# Patient Record
Sex: Male | Born: 1980 | Race: White | Hispanic: No | Marital: Single
Health system: Midwestern US, Community
[De-identification: ages and names within clinical notes are randomized; demographics above are authoritative.]

## PROBLEM LIST (undated history)

## (undated) DIAGNOSIS — K921 Melena: Principal | ICD-10-CM

## (undated) LAB — GXT EXERCISE (STRESS ONLY)
1 Minute Post HR: 173 {beats}/min
1.7 / 10% HR (Additional): 120 {beats}/min
1.7 / 10% HR: 115 {beats}/min
1.7 / 10% Time (Additional): 3
1.7 / 10% Time: 1
2 Minute Post HR: 142 {beats}/min
2.5 / 12% HR (Additional): 131 {beats}/min
2.5 / 12% HR: 130 {beats}/min
2.5 / 12% Time (Additional): 6
2.5 / 12% Time: 4
3 Minute Post HR: 129 {beats}/min
3.4 / 14% HR (Additional): 164 {beats}/min
3.4 / 14% HR: 144 {beats}/min
3.4 / 14% Time (Additional): 9
3.4 / 14% Time: 7
4 Minute Post HR: 123 {beats}/min
4.2 / 16% HR: 181 {beats}/min
4.2 / 16% Time: 10
5 Minute Post HR: 114 {beats}/min
6 Minute Post HR: 108 {beats}/min
7 Minute Post HR: 101 {beats}/min
Angina Score During Excercise: 0
CALC Duke Treadmill Score: 10
Exercise Duration (min): 10 minutes
Exercise Time (Seconds): 14 Seconds
Immediate Stop HR: 184 {beats}/min
Max Grade: 16
Max Speed: 4.2
ST Seg Deviation: 0 mm
Standing HR: 87 {beats}/min
Supine HR: 63
Target Heart Rate: 158

---

## 2006-05-06 IMAGING — CR DG CHEST 2V
2 series · 2 of 2 positions shown · non-contrast
Comparison: none

CLINICAL DATA: Chest pain. 
 CHEST - 2 VIEW - 06/20/05: 
 The heart size and mediastinal contours are within normal limits.  Both lungs are clear.  The visualized skeletal structures are unremarkable.

[w chest pa]
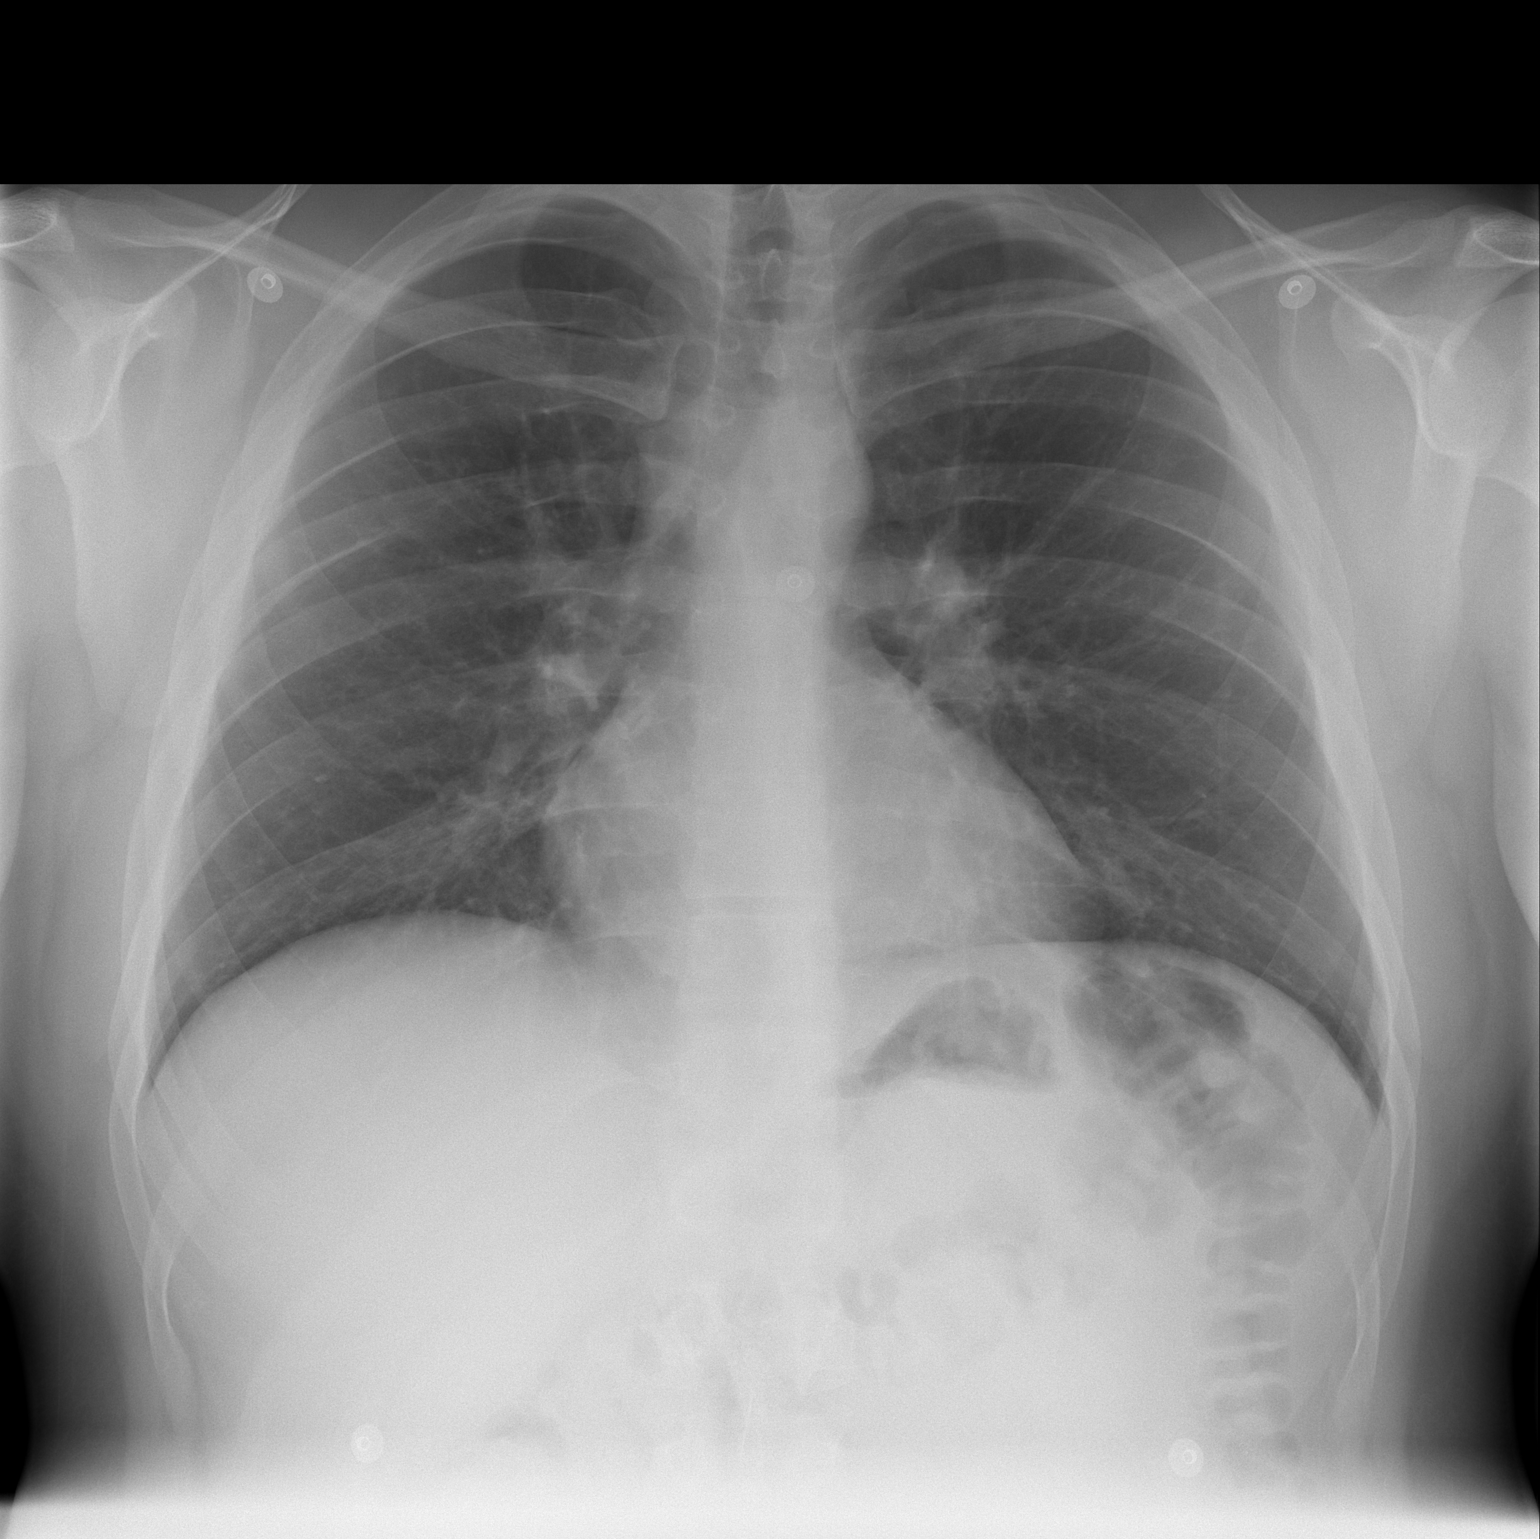

[w chest lat]
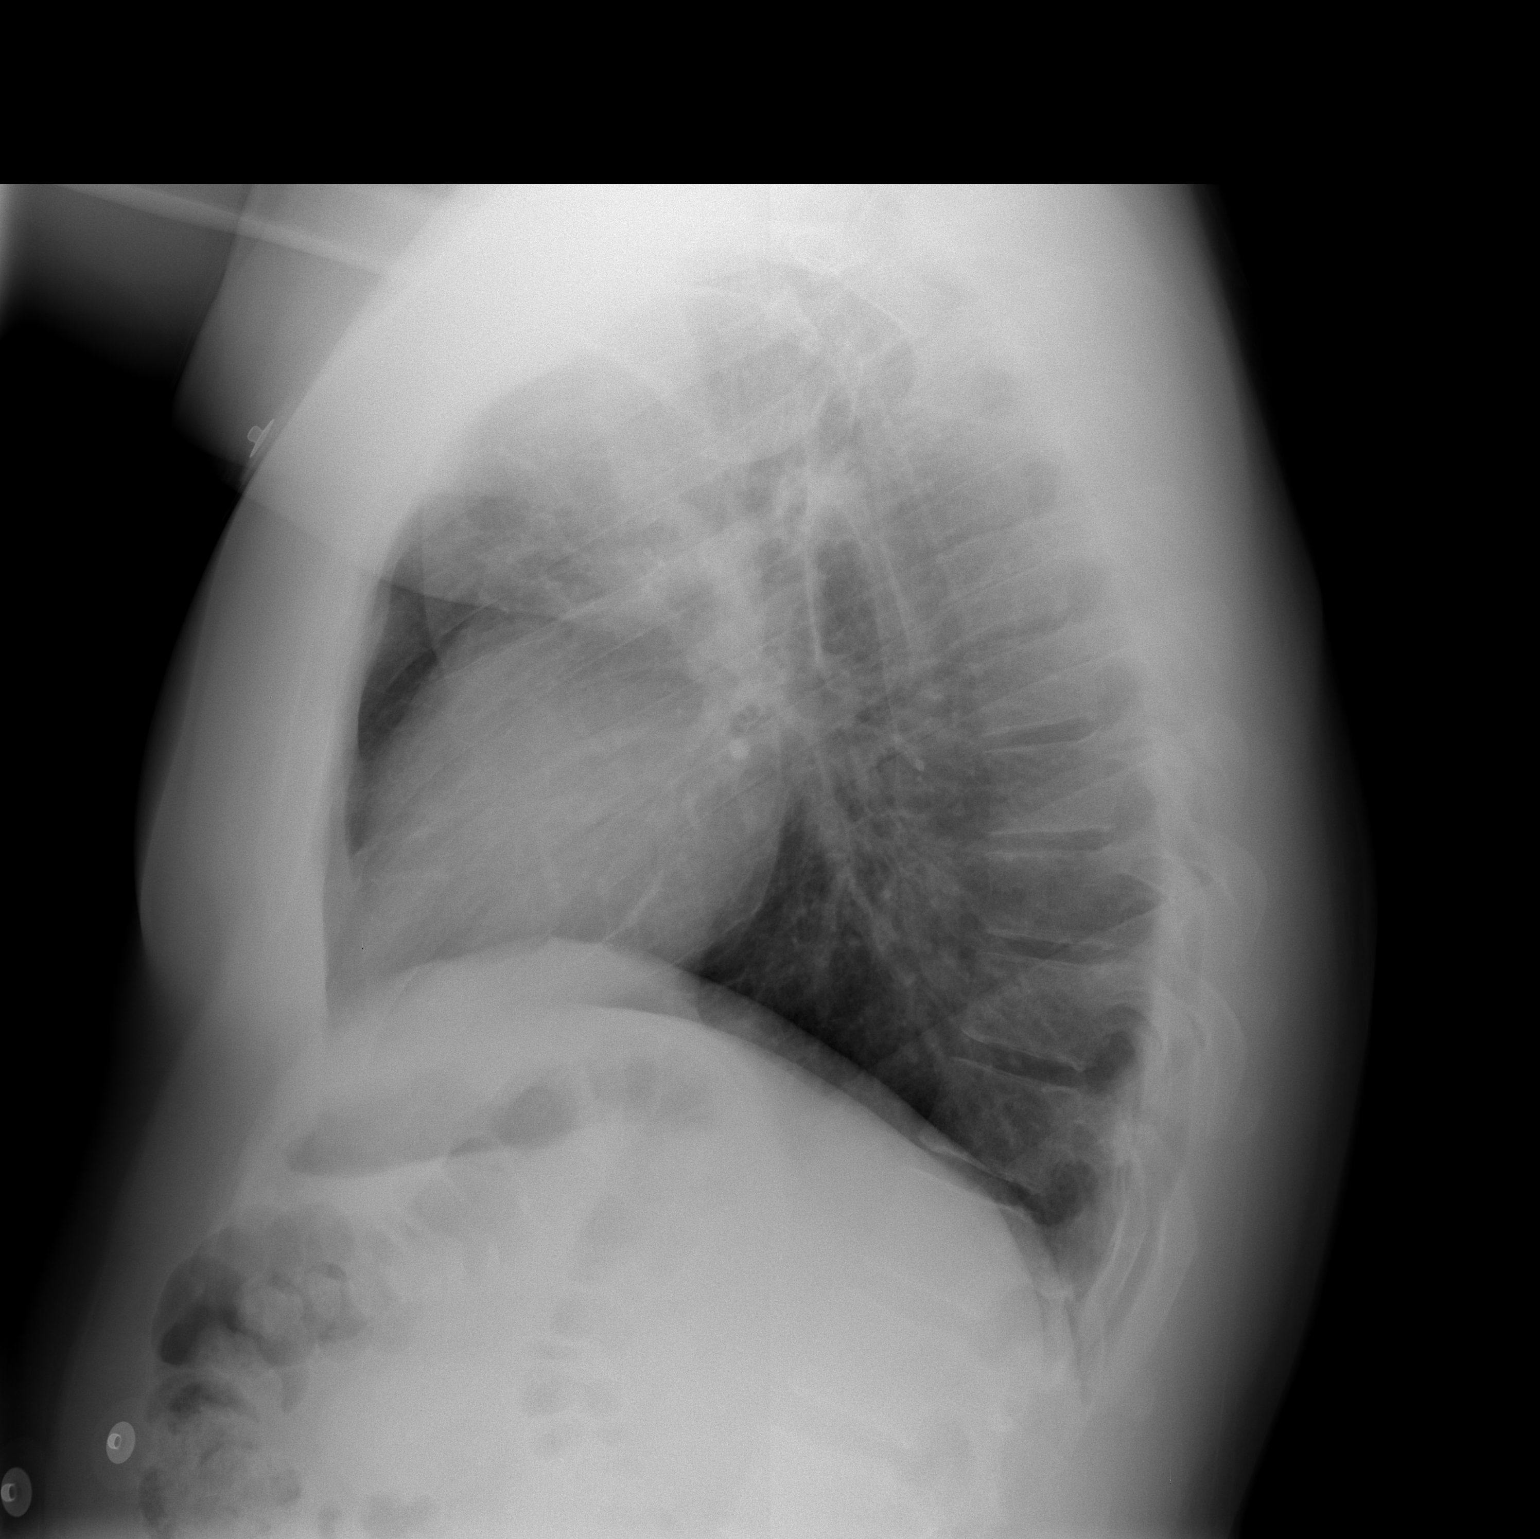

[2 of 2 positions shown; findings below may reference images not displayed]

IMPRESSION: No active cardiopulmonary disease.

## 2016-08-07 ENCOUNTER — Inpatient Hospital Stay: Admit: 2016-08-07 | Primary: Family Medicine

## 2016-08-07 NOTE — Other (Signed)
[facility] ??? Orthopaedics [facility], [place]  [address], [address]    [place], [place] [id number]  Phone: [phone]   Fax:     [phone]    Physical Therapy Daily Treatment Note  Date:  08/07/2016    Patient Name:  Zachary GibbsJoseph Gregory    DOB:  11/04/1981  MRN: [id number]  Restrictions/Precautions:    Medical/Treatment Diagnosis Information:  Diagnosis: S39.012 Lumbar Strain  Treatment Diagnosis: M45.5 Pain in Lumbar Spine  Insurance/Certification information:  [facility]: [facility]  Physician Information:  Referring Practitioner: Alyce Paganimothy J Linker  Plan of care signed (Y/N):     Date of Patient follow up with Physician:     G-Code (if applicable):      Date G-Code Applied:  08/07/16 [place]: 14% deficit  PT G-Codes  Functional Assessment Tool Used: [place]  Score: 14%  Functional Limitation: Changing and maintaining body position  Changing and Maintaining Body Position Current Status (L2440(G8981): At least 1 percent but less than 20 percent impaired, limited or restricted  Changing and Maintaining Body Position Goal Status [phone](G8982): 0 percent impaired, limited or restricted    Progress Note: [x]   Yes  []   No  Next due by: Visit #10  Or 09/06/16    Latex Allergy:  [x] NO      [] YES  Preferred Language for Healthcare:   [x] English       [] other:    Visit # Insurance Allowable   1  9/20 120     Pain level:  0-5/10  9/20     SUBJECTIVE:  See eval   9/20     OBJECTIVE:   ROM  9/20  Comments   Trunk flexion WNL Pain present   Trunk extension WNL    Trunk R sidebend WNL    Trunk L sidebend WNL    Trunk R rotation WNL    Trunk L rotation WNL    HS flexibility                 Strength  9/20 Left Right Comments   Hip flexion(L2) 5 5    Knee extension(L3) 5 5    Knee flexion(S1-2) 5 5 Pain in back with MMT   Ankle dorsiflexion(L4) 5 5    Toe extension(L5) 5 5    Ankle eversion/plantar flexion(S1) 5 5      Special tests 9/20  Comments   SLR negative    Slump test     Pelvic symmetry  R higher  than left    Segmental Spinal mobility     Heel walk negative    Toe walk negative    Tandem walk            DTR???s  9/20 Left Right Comments   Patellar(L3-L4) = =    Achilles(S1-S2) = =            Joint mobility: 9/20   [x] Normal    [] Hypo   [] Hyper    Palpation: Tenderness bilateral PSIS  9/20    Functional Mobility/Transfers: Independent with increased pain   9/20    Posture: Forward head and rounded shoulders; decreased lumbar lordosis in sitting  9/20    Gait: (include devices/WB status) WNL  9/20                         [x]  Patient history, allergies, meds reviewed. Medical chart reviewed. See intake form.  9/20  Review Of Systems (ROS):  [x] Performed Review of systems (Integumentary, CardioPulmonary, Neurological) by intake and observation. Intake form has been scanned into medical record. Patient has been instructed to contact their primary care physician regarding ROS issues if not already being addressed at this time.  9/20    RESTRICTIONS/PRECAUTIONS: None    Exercises/Interventions: BILATERAL  ROM/stretches     SKTC 30 sec x 5 Start 9/20   DKTC     Prayer stretch     Supine HS 30 sec x 5 Start 9/20   Pelvic tilt     Hook lying rotation     Cat and camel     Piriformis 30 sec x 5 Start 9/20   Strengthening     SLR     Quadruped alternate UE reaches     Quadruped alternate LE reaches     Quadruped alternate UE/LE reaches     [employer] heel raises     Sit ups      planks     Tband lat pulls     Tband rows         Manual Intervention             Prone PA      GISTM/STM      Lumbar Manip      SI Manip      Hip belt mobs      Hip LA distraction              Therapeutic Exercise and NMR EXR  [x]  (97110) Provided verbal/tactile cueing for activities related to strengthening, flexibility, endurance, ROM  for improvements in proximal hip and core control with self care, mobility, lifting and ambulation.  []  [phone]) Provided verbal/tactile cueing for activities related to improving balance,  coordination, kinesthetic sense, posture, motor skill, proprioception  to assist with core control in self care, mobility, lifting, and ambulation.     Therapeutic Activities:    []  [phone] or 95621) Provided verbal/tactile cueing for activities related to improving balance, coordination, kinesthetic sense, posture, motor skill, proprioception and motor activation to allow for proper function  with self care and ADLs  []  [phone]) Provided training and instruction to the patient for proper core and proximal hip recruitment and positioning with ambulation re-education     Home Exercise Program:    [x]  (78469) Reviewed/Progressed HEP activities related to strengthening, flexibility, endurance, ROM of core, proximal hip and LE for functional self-care, mobility, lifting and ambulation   []  (62952) Reviewed/Progressed HEP activities related to improving balance, coordination, kinesthetic sense, posture, motor skill, proprioception of core, proximal hip and LE for self care, mobility, lifting, and ambulation      Manual Treatments:  PROM / STM / Oscillations-Mobs:  G-I, II, III, IV (PA's, Inf., Post.)  []  (97140) Provided manual therapy to mobilize proximal hip and LS spine soft tissue/joints for the purpose of modulating pain, promoting relaxation,  increasing ROM, reducing/eliminating soft tissue swelling/inflammation/restriction, improving soft tissue extensibility and allowing for proper ROM for normal function with self care, mobility, lifting and ambulation.     Modalities:       Charges:  Timed Code Treatment Minutes: 30 + EVAL   Total Treatment Minutes: 70       [x]  EVAL (LOW) 97161 (typically 20 minutes face-to-face)  []  EVAL (MOD) 84132 (typically 30 minutes face-to-face)  []  EVAL (HIGH) 97163 (typically 45 minutes face-to-face)  []  RE-EVAL     [x]  GM(01027) x  2   []  IONTO  []  NMR (11914) x      []  VASO  []  Manual (97140) x       []  Other:  []  TA x       []  Mech T[employer] (78295)  []  ES(attended) (62130)      []   ES (un) (86578):     Goals:   Patient stated goal: Reduce pain; improve core strength and posture    Therapist goals for Patient:   Short Term Goals: To be achieved in: 2 weeks  1. Independent in HEP and progression per patient tolerance, in order to prevent re-injury.   2. Patient will have a decrease in pain to facilitate improvement in movement, function, and ADLs as indicated by Functional Deficits.    Long Term Goals: To be achieved in: 3-6 weeks  1. Disability index score of 10% or less for the [place] to assist with reaching prior level of function in 6 weeks.   2. Patient will demonstrate increased AROM to WNL, good LS mobility, good hip ROM to allow for proper joint functioning as indicated by patients Functional Deficits in 4 weeks.   3. Patient will demonstrate an increase in Strength to good proximal hip and core activation to allow for proper functional mobility as indicated by patients Functional Deficits in 6 weeks.   4. Patient will return to independent functional activities without increased symptoms or restriction in 4 weeks.     Progression Towards Functional goals:  []  Patient is progressing as expected towards functional goals listed.    []  Progression is slowed due to complexities listed.  []  Progression has been slowed due to co-morbidities.  [x]  Plan just implemented, too soon to assess goals progression  []  Other:     ASSESSMENT:  See eval    Treatment/Activity Tolerance:  [x]  Patient tolerated treatment well []  Patient limited by fatique  []  Patient limited by pain  []  Patient limited by other medical complications  []  Other:     Patient education:  9/20 HEP provided and reviewed     Prognosis: [x]  Good []  Fair  []  Poor    Patient Requires Follow-up: [x]  Yes  []  No    PLAN: See eval  []  Continue per plan of care []  Alter current plan (see comments)  [x]  Plan of care initiated []  Hold pending MD visit []  Discharge    Electronically signed by: Dillon Bjork PT, DPT

## 2016-08-07 NOTE — Plan of Care (Cosign Needed)
[facility] ??? Orthopaedics and Sports Rehabilitation  [address] [place], [place]  Phone [phone]  Fax [phone]      Physical Therapy Certification    Dear Referring Practitioner: Alyce Pagan,    We had the pleasure of evaluating the following patient for physical therapy services at [facility] and [facility].  A summary of our findings can be found in the initial assessment below.  This includes our plan of care.  If you have any questions or concerns regarding these findings, please do not hesitate to contact me at the office phone number checked above.  Thank you for the referral.       Physician Signature:_______________________________Date:__________________  By signing above (or electronic signature), therapist???s plan is approved by physician      Patient: Zachary Gibbs "Joe"   DOB: [birth year]   MRN: [id number]  Referring Physician: Referring Practitioner: Alyce Pagan      Evaluation Date: 08/07/2016      Medical Diagnosis Information:  Diagnosis: S39.012 Lumbar Strain   Treatment Diagnosis: M45.5 Pain in Lumbar Spine                                         Insurance information: PT Insurance Information: [employer]HC     Precautions/ Contra-indications:   Latex Allergy:  [x] NO      [] YES  Preferred Language for Healthcare:   [x] English       [] other:    SUBJECTIVE: Patient stated complaint: Patient states that the pain in his back started about 4-5 months ago. He states that the pain became worse and has started to travel down the left leg. He denies a particular incident or trauma. He states that his job is pretty physical and he is on his feet quite a bit and the job also entails driving for extended periods of time. He denies radiographs and MRIs being performed. He states he hasn't taken any of the prescribed anti-inflammatories due to the side effects. He states the pain is a dull pain that is constant and when he has to transition from sitting to  standing the pain in the back is sharp.     Relevant Medical History:None  Functional Disability Index/G-Codes:  PT G-Codes  Functional Assessment Tool Used: [place]  Score: 14%  Functional Limitation: Changing and maintaining body position  Changing and Maintaining Body Position Current Status (V7846): At least 1 percent but less than 20 percent impaired, limited or restricted  Changing and Maintaining Body Position Goal Status [phone]): 0 percent impaired, limited or restricted    Pain Scale: 0-5/10  Easing factors: ice  Provocative factors: Sitting for extended period of time; standing for extended period of time; transitioning from sitting to standing    Type: [x] Constant   [] Intermittent  [] Radiating [] Localized [] other:     Numbness/Tingling: Denies    Occupation/School: Geographical information systems officer for [employer]    Living Status/Prior Level of Function: Independent with ADLs and IADLs    OBJECTIVE:   ROM  2/84  Comments   Trunk flexion WNL Pain present   Trunk extension WNL    Trunk R sidebend WNL    Trunk L sidebend WNL    Trunk R rotation WNL    Trunk L rotation WNL    HS flexibility  Strength  9/20 Left Right Comments   Hip flexion(L2) 5 5    Knee extension(L3) 5 5    Knee flexion(S1-2) 5 5 Pain in back with MMT   Ankle dorsiflexion(L4) 5 5    Toe extension(L5) 5 5    Ankle eversion/plantar flexion(S1) 5 5      Special tests 9/20  Comments   SLR negative    Slump test     Pelvic symmetry  R higher than left    Segmental Spinal mobility     Heel walk negative    Toe walk negative    Tandem walk            DTR???s  9/20 Left Right Comments   Patellar(L3-L4) = =    Achilles(S1-S2) = =            Joint mobility: 9/20   [x] Normal    [] Hypo   [] Hyper    Palpation: Tenderness bilateral PSIS  9/20    Functional Mobility/Transfers: Independent with increased pain   9/20    Posture: Forward head and rounded shoulders; decreased lumbar lordosis in sitting  9/20    Gait: (include devices/WB status) WNL  9/20                          [x]  Patient history, allergies, meds reviewed. Medical chart reviewed. See intake form.  9/20    Review Of Systems (ROS):  [x] Performed Review of systems (Integumentary, CardioPulmonary, Neurological) by intake and observation. Intake form has been scanned into medical record. Patient has been instructed to contact their primary care physician regarding ROS issues if not already being addressed at this time.  9/20    Co-morbidities/Complexities (which will affect course of rehabilitation):   [x] None           Arthritic conditions   [] Rheumatoid arthritis (M05.9)  [] Osteoarthritis (M19.91)   Cardiovascular conditions   [] Hypertension (I10)  [] Hyperlipidemia (E78.5)  [] Angina pectoris (I20)  [] Atherosclerosis (I70)   Musculoskeletal conditions   [] Disc pathology   [] Congenital spine pathologies   [] Prior surgical intervention  [] Osteoporosis (M81.8)  [] Osteopenia (M85.8)   Endocrine conditions   [] Hypothyroid (E03.9)  [] Hyperthyroid Gastrointestinal conditions   [] Constipation (K59.00)   Metabolic conditions   [] Morbid obesity (E66.01)  [] Diabetes type 1(E10.65) or 2 (E11.65)   [] Neuropathy (G60.9)     Pulmonary conditions   [] Asthma (J45)  [] Coughing   [] COPD (J44.9)   Psychological Disorders  [] Anxiety (F41.9)  [] Depression (F32.9)   [] Other:   [] Other:           Barriers to/and or personal factors that will affect rehab potential:              [x] Age  [x] Sex              [x] Motivation/Lack of Motivation                        [] Co-Morbidities              [] Cognitive Function, education/learning barriers              [] Environmental, home barriers              [x] profession/work barriers  [] past PT/medical experience  [] other:  Justification: Patient is a motivated 35 year old male who presents to the clinic with reports of low back pain. [facility] as well as improve his  postural awareness and his core strength. Patient is active and his job involves him to be on his feet for  extended periods of time as well as sit for extended periods to drive and travel to different job sits; this increases patient's pain. Patient has no other pre-existing medical conditions and is motivated; he displays good rehab potential.     Falls Risk Assessment (30 days):   [x]  Falls Risk assessed and no intervention required.  []  Falls Risk assessed and Patient requires intervention due to being higher risk   TUG score (>12s at risk):     []  Falls education provided, including       G-Codes:  PT G-Codes  Functional Assessment Tool Used: [place]  Score: 14%  Functional Limitation: Changing and maintaining body position  Changing and Maintaining Body Position Current Status (J8119): At least 1 percent but less than 20 percent impaired, limited or restricted  Changing and Maintaining Body Position Goal Status [phone]): 0 percent impaired, limited or restricted    ASSESSMENT:   Functional Impairments:     [] Noted lumbar/proximal hip hypomobility   [] Noted lumbosacral and/or generalized hypermobility   [] Decreased Lumbosacral/hip/LE functional ROM   [x] Decreased core/proximal hip strength and neuromuscular control    [x] Decreased LE functional strength    [] Abnormal reflexes/sensation/myotomal/dermatomal deficits  [x] Reduced balance/proprioceptive control    [] other:      Functional Activity Limitations (from functional questionnaire and intake)   [x] Reduced ability to tolerate prolonged functional positions   [x] Reduced ability or difficulty with changes of positions or transfers between positions   [x] Reduced ability to maintain good posture and demonstrate good body mechanics with sitting, bending, and lifting   [] Reduced ability to sleep   [x]  Reduced ability or tolerance with driving and/or computer work   [x] Reduced ability to perform lifting, reaching, carrying tasks   [x] Reduced ability to squat   [] Reduced ability to forward bend   [x] Reduced ability to ambulate prolonged functional  periods/distances/surfaces   [] Reduced ability to ascend/descend stairs   [] other:       Participation Restrictions   [] Reduced participation in self care activities   [] Reduced participation in home management activities   [x] Reduced participation in work activities   [] Reduced participation in social activities.   [x] Reduced participation in sport/recreational activities.    Classification:   [x] Signs/symptoms consistent with Lumbar instability/stabilization subgroup.      [] Signs/symptoms consistent with Lumbar mobilization/manipulation subgroup, myotomes and dermatomes intact. Meets manipulation criteria.    [] Signs/symptoms consistent with Lumbar direction specific/centralization subgroup   [] Signs/symptoms consistent with Lumbar traction subgroup     [] Signs/symptoms consistent with lumbar facet dysfunction   [] Signs/symptoms consistent with lumbar stenosis type dysfunction   [] Signs/symptoms consistent with nerve root involvement including myotome & dermatome dysfunction   [] Signs/symptoms consistent with post-surgical status including: decreased ROM, strength and function.   [] signs/symptoms consistent with pathology which may benefit from Dry needling     [] other:      Prognosis/Rehab Potential:      [] Excellent   [x] Good    [] Fair   [] Poor    Tolerance of evaluation/treatment:    [] Excellent   [x] Good    [] Fair   [] Poor    Physical Therapy Evaluation Complexity Justification  [x]  A history of present problem with:  [x]  no personal factors and/or comorbidities that impact the plan of care;  [] 1-2 personal factors and/or comorbidities that impact the plan of care  [] 3 personal factors and/or comorbidities that impact the plan of care  [x]  An  examination of body systems using standardized tests and measures addressing any of the following: body structures and functions (impairments), activity limitations, and/or participation restrictions;:  [x]  a total of 1-2 or more elements   []  a total of 3 or more  elements   []  a total of 4 or more elements   [x]  A clinical presentation with:  [x]  stable and/or uncomplicated characteristics   []  evolving clinical presentation with changing characteristics  []  unstable and unpredictable characteristics;   [x]  Clinical decision making of [x]  low, []  moderate, []  high complexity using standardized patient assessment instrument and/or measurable assessment of functional outcome.    [x]  EVAL (LOW) 97161 (typically 20 minutes face-to-face)  []  EVAL (MOD) 16109 (typically 30 minutes face-to-face)  []  EVAL (HIGH) 97163 (typically 45 minutes face-to-face)  []  RE-EVAL         PLAN: Begin PT focusing on: proximal hip mobilizations, LB mobs, LB core activation, proximal hip activation, and HEP    Frequency/Duration:  1-2 days per week for 3-6 Weeks:  Interventions:  [x]   Therapeutic exercise including: strength training, ROM, for LE, Glutes and core   [x]   NMR activation and proprioception for glutes , LE and Core   [x]   Manual therapy as indicated for Hip complex, LE and spine to include: Dry Needling/IASTM, STM, PROM, Gr I-IV mobilizations, manipulation.   [x]   Modalities as needed that may include: thermal agents, E-stim, Biofeedback, [place], iontophoresis as indicated  [x]   Patient education on joint protection, postural re-education, activity modification, progression of HEP.    HEP instruction: Provided and Reviewed with patient (see scanned forms)    GOALS:  Patient stated goal: Reduce pain; improve core strength and posture    Therapist goals for Patient:   Short Term Goals: To be achieved in: 2 weeks  1. Independent in HEP and progression per patient tolerance, in order to prevent re-injury.   2. Patient will have a decrease in pain to facilitate improvement in movement, function, and ADLs as indicated by Functional Deficits.    Long Term Goals: To be achieved in: 3-6 weeks  1. Disability index score of 10% or less for the [place] to assist with reaching prior level of function in 6  weeks.   2. Patient will demonstrate increased AROM to WNL, good LS mobility, good hip ROM to allow for proper joint functioning as indicated by patients Functional Deficits in 4 weeks.   3. Patient will demonstrate an increase in Strength to good proximal hip and core activation to allow for proper functional mobility as indicated by patients Functional Deficits in 6 weeks.   4. Patient will return to independent functional activities without increased symptoms or restriction in 4 weeks.       Electronically signed by:  Dillon Bjork, PT, DPT

## 2016-08-09 ENCOUNTER — Inpatient Hospital Stay: Admit: 2016-08-09 | Primary: Family Medicine

## 2016-08-09 NOTE — Other (Signed)
[facility] ??? Orthopaedics and Sports Rehabilitation, [facility]  [address]    [place], [place] [address]  Phone: [phone]   Fax:     [phone]    Physical Therapy Daily Treatment Note  Date:  08/09/2016    Patient Name:  Zachary Gregory  "Zachary Gregory"  DOB:  1981/04/02  MRN: [id number]  Restrictions/Precautions:    Medical/Treatment Diagnosis Information:  Diagnosis: S39.012 Lumbar Strain  Treatment Diagnosis: M45.5 Pain in Lumbar Spine  Insurance/Certification information:  PT Insurance Information: [facility]  Physician Information:  Referring Practitioner: Alyce Pagan  Plan of care signed (Y/N):     Date of Patient follow up with Physician:     G-Code (if applicable):      Date G-Code Applied:  08/07/16 [place]: 14% deficit       Progress Note: []   Yes  [x]   No  Next due by: Visit #10  Or 09/06/16    Latex Allergy:  [x] NO      [] YES  Preferred Language for Healthcare:   [x] English       [] other:    Visit # Insurance Allowable   2  9/22 120     Pain level:  6/10  9/22     SUBJECTIVE:  9/22 Patient states that his back is bothering him today because he has been standing all night. He states that he notices he has been sitting more and it is harder to get back up due to fatigue and pain.       OBJECTIVE:   ROM  9/20  Comments   Trunk flexion WNL Pain present   Trunk extension WNL    Trunk R sidebend WNL    Trunk L sidebend WNL    Trunk R rotation WNL    Trunk L rotation WNL    HS flexibility                 Strength  9/20 Left Right Comments   Hip flexion(L2) 5 5    Knee extension(L3) 5 5    Knee flexion(S1-2) 5 5 Pain in back with MMT   Ankle dorsiflexion(L4) 5 5    Toe extension(L5) 5 5    Ankle eversion/plantar flexion(S1) 5 5      Special tests 9/20  Comments   SLR negative    Slump test     Pelvic symmetry  R higher than left    Segmental Spinal mobility     Heel walk negative    Toe walk negative    Tandem walk            DTR???s  9/20 Left Right Comments   Patellar(L3-L4) = =     Achilles(S1-S2) = =            Joint mobility: 9/20   [x] Normal    [] Hypo   [] Hyper    Palpation: Tenderness bilateral PSIS  9/20    Functional Mobility/Transfers: Independent with increased pain   9/20    Posture: Forward head and rounded shoulders; decreased lumbar lordosis in sitting  9/20    Gait: (include devices/WB status) WNL  9/20                         [x]  Patient history, allergies, meds reviewed. Medical chart reviewed. See intake form.  9/20    Review Of Systems (ROS):  [x] Performed Review of systems (Integumentary, CardioPulmonary, Neurological) by intake and observation. Intake  form has been scanned into medical record. Patient has been instructed to contact their primary care physician regarding ROS issues if not already being addressed at this time.  9/20    RESTRICTIONS/PRECAUTIONS: None    Exercises/Interventions: BILATERAL  ROM/stretches     SKTC 30 sec x 5 Start 9/20   DKTC     Prayer stretch     Supine HS 30 sec x 5 Start 9/20   Pelvic tilt     Hook lying rotation 10 sec x 10 each side  Start 9/22   Cat and camel     Piriformis 30 sec x 5 Start 9/20   Strengthening     SLR     Quadruped alternate UE reaches     Quadruped alternate LE reaches     Quadruped alternate UE/LE reaches     [employer] SachsBall squats     Ball heel raises     Sit ups      planks     Tband lat pulls     Tband rows     TA Iso 10 sec x 10 Start 9/22   TA Iso w/ ADD 10 sec x 10 Start 9/22   TA Iso with ABD 3 x 10 Blue band; start 9/22   TA Iso w/ heel slide 30x  Start 9/22       Manual Intervention             Prone PA      GISTM/STM      Lumbar Manip      SI Manip      Hip belt mobs      Hip LA distraction              Therapeutic Exercise and NMR EXR  [x]  (97110) Provided verbal/tactile cueing for activities related to strengthening, flexibility, endurance, ROM  for improvements in proximal hip and core control with self care, mobility, lifting and ambulation.  []  [phone](97112) Provided verbal/tactile cueing for activities related to  improving balance, coordination, kinesthetic sense, posture, motor skill, proprioception  to assist with core control in self care, mobility, lifting, and ambulation.     Therapeutic Activities:    []  [phone](97112 or [phone]) Provided verbal/tactile cueing for activities related to improving balance, coordination, kinesthetic sense, posture, motor skill, proprioception and motor activation to allow for proper function  with self care and ADLs  []  [phone](97116) Provided training and instruction to the patient for proper core and proximal hip recruitment and positioning with ambulation re-education     Home Exercise Program:    [x]  (94854(97110) Reviewed/Progressed HEP activities related to strengthening, flexibility, endurance, ROM of core, proximal hip and LE for functional self-care, mobility, lifting and ambulation   []  (62703(97112) Reviewed/Progressed HEP activities related to improving balance, coordination, kinesthetic sense, posture, motor skill, proprioception of core, proximal hip and LE for self care, mobility, lifting, and ambulation      Manual Treatments:  PROM / STM / Oscillations-Mobs:  G-I, II, III, IV (PA's, Inf., Post.)  []  (97140) Provided manual therapy to mobilize proximal hip and LS spine soft tissue/joints for the purpose of modulating pain, promoting relaxation,  increasing ROM, reducing/eliminating soft tissue swelling/inflammation/restriction, improving soft tissue extensibility and allowing for proper ROM for normal function with self care, mobility, lifting and ambulation.     Modalities:       Charges:  Timed Code Treatment Minutes: 45   Total Treatment Minutes: 65  9:30-10:35        []   EVAL (LOW) 97161 (typically 20 minutes face-to-face)  []  EVAL (MOD) 09811 (typically 30 minutes face-to-face)  []  EVAL (HIGH) 97163 (typically 45 minutes face-to-face)  []  RE-EVAL     [x]  BJ(47829) x  2   []  IONTO  [x]  NMR (97112) x  1   []  VASO  []  Manual (97140) x       []  Other:  []  TA x       []  Mech Traction (56213)  []   ES(attended) (08657)      []  ES (un) (84696):     Goals:   Patient stated goal: Reduce pain; improve core strength and posture    Therapist goals for Patient:   Short Term Goals: To be achieved in: 2 weeks  1. Independent in HEP and progression per patient tolerance, in order to prevent re-injury.   2. Patient will have a decrease in pain to facilitate improvement in movement, function, and ADLs as indicated by Functional Deficits.    Long Term Goals: To be achieved in: 3-6 weeks  1. Disability index score of 10% or less for the [place] to assist with reaching prior level of function in 6 weeks.   2. Patient will demonstrate increased AROM to WNL, good LS mobility, good hip ROM to allow for proper joint functioning as indicated by patients Functional Deficits in 4 weeks.   3. Patient will demonstrate an increase in Strength to good proximal hip and core activation to allow for proper functional mobility as indicated by patients Functional Deficits in 6 weeks.   4. Patient will return to independent functional activities without increased symptoms or restriction in 4 weeks.     Progression Towards Functional goals:  [x]  Patient is progressing as expected towards functional goals listed.    []  Progression is slowed due to complexities listed.  []  Progression has been slowed due to co-morbidities.  []  Plan just implemented, too soon to assess goals progression  []  Other:     ASSESSMENT:  See eval    Treatment/Activity Tolerance:  [x]  Patient tolerated treatment well []  Patient limited by fatique  []  Patient limited by pain  []  Patient limited by other medical complications  [x]  Other:  9/22 Patient tolerated treatment well this session with no reports of pain with activities. Patient had good tolerance to addition of core exercises with correct form. Patient reported fatigue with activities.      Patient education:  9/20 HEP provided and reviewed     Prognosis: [x]  Good []  Fair  []  Poor    Patient Requires  Follow-up: [x]  Yes  []  No    PLAN: See eval  [x]  Continue per plan of care []  Alter current plan (see comments)  []  Plan of care initiated []  Hold pending MD visit []  Discharge    Electronically signed by: Dillon Bjork PT, DPT

## 2016-08-12 ENCOUNTER — Inpatient Hospital Stay: Admit: 2016-08-12 | Primary: Family Medicine

## 2016-08-12 NOTE — Other (Signed)
[facility] ??? Orthopaedics and Sports Rehabilitation, [facility]  [address]    [place], [place] [address]  Phone: [phone]   Fax:     [phone]    Physical Therapy Daily Treatment Note  Date:  08/12/2016    Patient Name:  Zachary Gregory  "Zachary Gregory"  DOB:  1981/07/23  MRN: [id number]  Restrictions/Precautions:    Medical/Treatment Diagnosis Information:  Diagnosis: S39.012 Lumbar Strain  Treatment Diagnosis: M45.5 Pain in Lumbar Spine  Insurance/Certification information:  PT Insurance Information: [facility]  Physician Information:  Referring Practitioner: Alyce Pagan  Plan of care signed (Y/N):     Date of Patient follow up with Physician:     G-Code (if applicable):      Date G-Code Applied:  08/07/16 [place]: 14% deficit       Progress Note: []   Yes  [x]   No  Next due by: Visit #10  Or 09/06/16    Latex Allergy:  [x] NO      [] YES  Preferred Language for Healthcare:   [x] English       [] other:    Visit # Insurance Allowable   3  9/25 120     Pain level:  1/10  9/25     SUBJECTIVE:  9/25 Patient states that he has noticed increased stiffness in his back when he stands without shoes. He states he is doing pretty well today and felt good after last session.      OBJECTIVE:   ROM  9/20  Comments   Trunk flexion WNL Pain present   Trunk extension WNL    Trunk R sidebend WNL    Trunk L sidebend WNL    Trunk R rotation WNL    Trunk L rotation WNL    HS flexibility                 Strength  9/20 Left Right Comments   Hip flexion(L2) 5 5    Knee extension(L3) 5 5    Knee flexion(S1-2) 5 5 Pain in back with MMT   Ankle dorsiflexion(L4) 5 5    Toe extension(L5) 5 5    Ankle eversion/plantar flexion(S1) 5 5      Special tests 9/20  Comments   SLR negative    Slump test     Pelvic symmetry  R higher than left    Segmental Spinal mobility     Heel walk negative    Toe walk negative    Tandem walk            DTR???s  9/20 Left Right Comments   Patellar(L3-L4) = =    Achilles(S1-S2) = =            Joint  mobility: 9/20   [x] Normal    [] Hypo   [] Hyper    Palpation: Tenderness bilateral PSIS  9/20    Functional Mobility/Transfers: Independent with increased pain   9/20    Posture: Forward head and rounded shoulders; decreased lumbar lordosis in sitting  9/20    Gait: (include devices/WB status) WNL  9/20                         [x]  Patient history, allergies, meds reviewed. Medical chart reviewed. See intake form.  9/20    Review Of Systems (ROS):  [x] Performed Review of systems (Integumentary, CardioPulmonary, Neurological) by intake and observation. Intake form has been scanned into medical record. Patient has been  instructed to contact their primary care physician regarding ROS issues if not already being addressed at this time.  9/20    RESTRICTIONS/PRECAUTIONS: None    Exercises/Interventions: BILATERAL  ROM/stretches     SKTC 30 sec x 5 Start 9/20   DKTC     Prayer stretch     Supine HS 30 sec x 5 Start 9/20   Pelvic tilt     Hook lying rotation 10 sec x 10 each side  Start 9/22   Cat and camel     Piriformis 30 sec x 5 Start 9/20   Strengthening     SLR 3 x 10 Start 9/25   Quadruped alternate UE reaches     Quadruped alternate LE reaches     Quadruped alternate UE/LE reaches     Ball squats     Ball heel raises     TA Iso in 90/90 with UE flexion 3 x 10 2# ball; start 9/25   planks     Tband lat pulls     Tband rows     TA Iso 10 sec x 10 Start 9/22   TA Iso w/ ADD 10 sec x 10  10 sec x 10 in 90/90 Start 9/22   TA Iso with ABD 3 x 10 silver band; ^9/25   TA Iso w/ heel slide 30x  Start 9/22       Manual Intervention             Prone PA      GISTM/STM      Lumbar Manip      SI Manip      Hip belt mobs      Hip LA distraction              Therapeutic Exercise and NMR EXR  [x]  (97110) Provided verbal/tactile cueing for activities related to strengthening, flexibility, endurance, ROM  for improvements in proximal hip and core control with self care, mobility, lifting and ambulation.  []  [phone]) Provided  verbal/tactile cueing for activities related to improving balance, coordination, kinesthetic sense, posture, motor skill, proprioception  to assist with core control in self care, mobility, lifting, and ambulation.     Therapeutic Activities:    []  [phone] or [phone]) Provided verbal/tactile cueing for activities related to improving balance, coordination, kinesthetic sense, posture, motor skill, proprioception and motor activation to allow for proper function  with self care and ADLs  []  [phone]) Provided training and instruction to the patient for proper core and proximal hip recruitment and positioning with ambulation re-education     Home Exercise Program:    [x]  (25956) Reviewed/Progressed HEP activities related to strengthening, flexibility, endurance, ROM of core, proximal hip and LE for functional self-care, mobility, lifting and ambulation   []  (38756) Reviewed/Progressed HEP activities related to improving balance, coordination, kinesthetic sense, posture, motor skill, proprioception of core, proximal hip and LE for self care, mobility, lifting, and ambulation      Manual Treatments:  PROM / STM / Oscillations-Mobs:  G-I, II, III, IV (PA's, Inf., Post.)  []  (97140) Provided manual therapy to mobilize proximal hip and LS spine soft tissue/joints for the purpose of modulating pain, promoting relaxation,  increasing ROM, reducing/eliminating soft tissue swelling/inflammation/restriction, improving soft tissue extensibility and allowing for proper ROM for normal function with self care, mobility, lifting and ambulation.     Modalities:       Charges:  Timed Code Treatment Minutes: 55   Total Treatment Minutes: 73  5:00-6:13        []  EVAL (LOW) 97161 (typically 20 minutes face-to-face)  []  EVAL (MOD) 96045 (typically 30 minutes face-to-face)  []  EVAL (HIGH) 97163 (typically 45 minutes face-to-face)  []  RE-EVAL     [x]  WU(98119) x  2   []  IONTO  [x]  NMR (97112) x  1   []  VASO  []  Manual (97140) x       []   Other:  [x]  TA x  1    []  Mech Traction (14782)  []  ES(attended) (95621)      []  ES (un) (30865):     Goals:   Patient stated goal: Reduce pain; improve core strength and posture    Therapist goals for Patient:   Short Term Goals: To be achieved in: 2 weeks  1. Independent in HEP and progression per patient tolerance, in order to prevent re-injury.   2. Patient will have a decrease in pain to facilitate improvement in movement, function, and ADLs as indicated by Functional Deficits.    Long Term Goals: To be achieved in: 3-6 weeks  1. Disability index score of 10% or less for the [place] to assist with reaching prior level of function in 6 weeks.   2. Patient will demonstrate increased AROM to WNL, good LS mobility, good hip ROM to allow for proper joint functioning as indicated by patients Functional Deficits in 4 weeks.   3. Patient will demonstrate an increase in Strength to good proximal hip and core activation to allow for proper functional mobility as indicated by patients Functional Deficits in 6 weeks.   4. Patient will return to independent functional activities without increased symptoms or restriction in 4 weeks.     Progression Towards Functional goals:  [x]  Patient is progressing as expected towards functional goals listed.    []  Progression is slowed due to complexities listed.  []  Progression has been slowed due to co-morbidities.  []  Plan just implemented, too soon to assess goals progression  []  Other:     ASSESSMENT:  See eval    Treatment/Activity Tolerance:  [x]  Patient tolerated treatment well []  Patient limited by fatique  []  Patient limited by pain  []  Patient limited by other medical complications  [x]  Other:  9/25 Patient tolerated treatment well this session with fatigue at end of session. Patient challenged and fatigued by TA activation exercises with LEs at 90/90 position. Continue to progress and challenged core exercises.       Patient education:  9/20 HEP provided and reviewed      Prognosis: [x]  Good []  Fair  []  Poor    Patient Requires Follow-up: [x]  Yes  []  No    PLAN: See eval  [x]  Continue per plan of care []  Alter current plan (see comments)  []  Plan of care initiated []  Hold pending MD visit []  Discharge    Electronically signed by: Dillon Bjork PT, DPT

## 2016-08-16 ENCOUNTER — Inpatient Hospital Stay: Admit: 2016-08-16 | Primary: Family Medicine

## 2016-08-16 NOTE — Other (Signed)
[facility] ??? Orthopaedics and Sports Rehabilitation, [facility]  [address]    [place], [place] [address]  Phone: [phone]   Fax:     [phone]    Physical Therapy Daily Treatment Note  Date:  08/16/2016    Patient Name:  Zachary Gregory  "[name]"  DOB:  [birth year]  MRN: [id number]  Restrictions/Precautions:    Medical/Treatment Diagnosis Information:  Diagnosis: S39.012 Lumbar Strain  Treatment Diagnosis: M45.5 Pain in Lumbar Spine  Insurance/Certification information:  PT Insurance Information: [facility]  Physician Information:  Referring Practitioner: Alyce Pagan  Plan of care signed (Y/N):     Date of Patient follow up with Physician:     G-Code (if applicable):      Date G-Code Applied:  08/07/16 [place]: 14% deficit       Progress Note: []   Yes  [x]   No  Next due by: Visit #10  Or 09/06/16    Latex Allergy:  [x] NO      [] YES  Preferred Language for Healthcare:   [x] English       [] other:    Visit # Insurance Allowable   4  9/29 120     Pain level:  3/10  9/29     SUBJECTIVE:  9/29 Patient states that he has been working a lot this weekend and feels okay today but states that yesterday he was really sore and had increased pain. He states that he is having some numbness around his knee currently.      OBJECTIVE:   ROM  9/20  Comments   Trunk flexion WNL Pain present   Trunk extension WNL    Trunk R sidebend WNL    Trunk L sidebend WNL    Trunk R rotation WNL    Trunk L rotation WNL    HS flexibility                 Strength  9/20 Left Right Comments   Hip flexion(L2) 5 5    Knee extension(L3) 5 5    Knee flexion(S1-2) 5 5 Pain in back with MMT   Ankle dorsiflexion(L4) 5 5    Toe extension(L5) 5 5    Ankle eversion/plantar flexion(S1) 5 5      Special tests 9/20  Comments   SLR negative    Slump test     Pelvic symmetry  R higher than left    Segmental Spinal mobility     Heel walk negative    Toe walk negative    Tandem walk            DTR???s  9/20 Left Right Comments   Patellar(L3-L4) =  =    Achilles(S1-S2) = =            Joint mobility: 9/20   [x] Normal    [] Hypo   [] Hyper    Palpation: Tenderness bilateral PSIS  9/20    Functional Mobility/Transfers: Independent with increased pain   9/20    Posture: Forward head and rounded shoulders; decreased lumbar lordosis in sitting  9/20    Gait: (include devices/WB status) WNL  9/20                         [x]  Patient history, allergies, meds reviewed. Medical chart reviewed. See intake form.  9/20    Review Of Systems (ROS):  [x] Performed Review of systems (Integumentary, CardioPulmonary, Neurological) by intake and observation. Intake form  has been scanned into medical record. Patient has been instructed to contact their primary care physician regarding ROS issues if not already being addressed at this time.  9/20    RESTRICTIONS/PRECAUTIONS: None    Exercises/Interventions: BILATERAL  ROM/stretches     SKTC 30 sec x 5 Start 9/20   DKTC     Prayer stretch     Supine HS 30 sec x 5 Start 9/20   Pelvic tilt     Hook lying rotation 10 sec x 10 each side  Start 9/22   Cat and camel     Piriformis 30 sec x 5 Start 9/20   Strengthening     SLR 3 x 10; 1# ^9/29   Quadruped alternate UE reaches 3 x 10 Start 9/29   Quadruped alternate LE reaches 3 x 10 Start 9/29; cane as feedback   Quadruped alternate UE/LE reaches     Ball squats     Ball heel raises     TA Iso in 90/90 with UE flexion 3 x 10 4# ball; ^9/28   clamshells 3 x 10; silver Start 9/29   Tband lat pulls     Tband rows     TA Iso 10 sec x 10 Start 9/22   TA Iso w/ ADD 10 sec x 10  10 sec x 10 in 90/90 Start 9/22   TA Iso with ABD 3 x 10 silver band; ^9/25   TA Iso w/ heel slide 30x  Start 9/22       Manual Intervention             Prone PA      GISTM/STM      Lumbar Manip      SI Manip      Hip belt mobs      Hip LA distraction              Therapeutic Exercise and NMR EXR  [x]  (97110) Provided verbal/tactile cueing for activities related to strengthening, flexibility, endurance, ROM  for improvements  in proximal hip and core control with self care, mobility, lifting and ambulation.  [x]  [phone]) Provided verbal/tactile cueing for activities related to improving balance, coordination, kinesthetic sense, posture, motor skill, proprioception  to assist with core control in self care, mobility, lifting, and ambulation.     Therapeutic Activities:    [x]  [phone](97112 or [phone]) Provided verbal/tactile cueing for activities related to improving balance, coordination, kinesthetic sense, posture, motor skill, proprioception and motor activation to allow for proper function  with self care and ADLs  []  [phone](97116) Provided training and instruction to the patient for proper core and proximal hip recruitment and positioning with ambulation re-education     Home Exercise Program:    [x]  ([phone](97110) Reviewed/Progressed HEP activities related to strengthening, flexibility, endurance, ROM of core, proximal hip and LE for functional self-care, mobility, lifting and ambulation   []  (97112) Reviewed/Progressed HEP activities related to improving balance, coordination, kinesthetic sense, posture, motor skill, proprioception of core, proximal hip and LE for self care, mobility, lifting, and ambulation      Manual Treatments:  PROM / STM / Oscillations-Mobs:  G-I, II, III, IV (PA's, Inf., Post.)  []  (97140) Provided manual therapy to mobilize proximal hip and LS spine soft tissue/joints for the purpose of modulating pain, promoting relaxation,  increasing ROM, reducing/eliminating soft tissue swelling/inflammation/restriction, improving soft tissue extensibility and allowing for proper ROM for normal function with self care, mobility, lifting and ambulation.  Modalities:       Charges:  Timed Code Treatment Minutes: 55   Total Treatment Minutes: 73  9:45- 11:58       []  EVAL (LOW) 97161 (typically 20 minutes face-to-face)  []  EVAL (MOD) [address] (typically 30 minutes face-to-face)  []  EVAL (HIGH) 97163 (typically 45 minutes face-to-face)  []   RE-EVAL     [x]  UE(45409) x  2   []  IONTO  [x]  NMR (97112) x  1   []  VASO  []  Manual (97140) x       []  Other:  [x]  TA x  1    []  Mech Traction (81191)  []  ES(attended) (47829)      []  ES (un) (56213):     Goals:   Patient stated goal: Reduce pain; improve core strength and posture    Therapist goals for Patient:   Short Term Goals: To be achieved in: 2 weeks  1. Independent in HEP and progression per patient tolerance, in order to prevent re-injury.   2. Patient will have a decrease in pain to facilitate improvement in movement, function, and ADLs as indicated by Functional Deficits.    Long Term Goals: To be achieved in: 3-6 weeks  1. Disability index score of 10% or less for the [place] to assist with reaching prior level of function in 6 weeks.   2. Patient will demonstrate increased AROM to WNL, good LS mobility, good hip ROM to allow for proper joint functioning as indicated by patients Functional Deficits in 4 weeks.   3. Patient will demonstrate an increase in Strength to good proximal hip and core activation to allow for proper functional mobility as indicated by patients Functional Deficits in 6 weeks.   4. Patient will return to independent functional activities without increased symptoms or restriction in 4 weeks.     Progression Towards Functional goals:  [x]  Patient is progressing as expected towards functional goals listed.    []  Progression is slowed due to complexities listed.  []  Progression has been slowed due to co-morbidities.  []  Plan just implemented, too soon to assess goals progression  []  Other:     ASSESSMENT:  See eval    Treatment/Activity Tolerance:  [x]  Patient tolerated treatment well []  Patient limited by fatique  []  Patient limited by pain  []  Patient limited by other medical complications  [x]  Other:  9/29  Patient tolerated treatment well this session with fatigue at end of session. Patient reported no pain. Good tolerance to addition of alternating UE and LE exercises with core  activation as primary goal. Required verbal and tactile cues to complete LE alternating exercises with use of cane as biofeedback. Continue to progress as tolerated.     Patient education:  9/20 HEP provided and reviewed     Prognosis: [x]  Good []  Fair  []  Poor    Patient Requires Follow-up: [x]  Yes  []  No    PLAN:   [x]  Continue per plan of care []  Alter current plan (see comments)  []  Plan of care initiated []  Hold pending MD visit []  Discharge    Electronically signed by: Dillon Bjork PT, DPT

## 2016-08-19 NOTE — Other (Signed)
[facility] ??? Orthopaedics and Sports Rehabilitation, [facility]  [address], [address]    [place], [place] [address]  Phone: [phone]   Fax:     [phone]    Physical Therapy Daily Treatment Note  Date:  08/19/2016    Patient Name:  Zachary Gregory  "[name]"  DOB:  [birth year]  MRN: [id number]  Restrictions/Precautions:    Medical/Treatment Diagnosis Information:  Diagnosis: S39.012 Lumbar Strain  Treatment Diagnosis: M45.5 Pain in Lumbar Spine  Insurance/Certification information:  PT Insurance Information: [facility]  Physician Information:  Referring Practitioner: Alyce Pagan  Plan of care signed (Y/N):     Date of Patient follow up with Physician:     G-Code (if applicable):      Date G-Code Applied:  08/07/16 [place]: 14% deficit       Progress Note: []   Yes  [x]   No  Next due by: Visit #10  Or 09/06/16    Latex Allergy:  [x] NO      [] YES  Preferred Language for Healthcare:   [x] English       [] other:    Visit # Insurance Allowable   5  10/2 120     Pain level:  2-3/10  10/2     SUBJECTIVE:  10/2 Patient states that he was stiff when he woke up but is feeling okay today. He reports being able to stand at work for longer periods of time.      OBJECTIVE:   ROM  9/20  Comments   Trunk flexion WNL Pain present   Trunk extension WNL    Trunk R sidebend WNL    Trunk L sidebend WNL    Trunk R rotation WNL    Trunk L rotation WNL    HS flexibility                 Strength  9/20 Left Right Comments   Hip flexion(L2) 5 5    Knee extension(L3) 5 5    Knee flexion(S1-2) 5 5 Pain in back with MMT   Ankle dorsiflexion(L4) 5 5    Toe extension(L5) 5 5    Ankle eversion/plantar flexion(S1) 5 5      Special tests 9/20  Comments   SLR negative    Slump test     Pelvic symmetry  R higher than left    Segmental Spinal mobility     Heel walk negative    Toe walk negative    Tandem walk            DTR???s  9/20 Left Right Comments   Patellar(L3-L4) = =    Achilles(S1-S2) = =            Joint mobility:  9/20   [x] Normal    [] Hypo   [] Hyper    Palpation: Tenderness bilateral PSIS  9/20    Functional Mobility/Transfers: Independent with increased pain   9/20    Posture: Forward head and rounded shoulders; decreased lumbar lordosis in sitting  9/20    Gait: (include devices/WB status) WNL  9/20                         [x]  Patient history, allergies, meds reviewed. Medical chart reviewed. See intake form.  9/20    Review Of Systems (ROS):  [x] Performed Review of systems (Integumentary, CardioPulmonary, Neurological) by intake and observation. Intake form has been scanned into medical record. Patient has been instructed to  contact their primary care physician regarding ROS issues if not already being addressed at this time.  9/20    RESTRICTIONS/PRECAUTIONS: None    Exercises/Interventions: BILATERAL  ROM/stretches     SKTC 30 sec x 5 Start 9/20   DKTC     Prayer stretch     Supine HS 30 sec x 5 Start 9/20   Pelvic tilt     Hook lying rotation 10 sec x 10 each side  Start 9/22   Cat and camel     Piriformis 30 sec x 5 Start 9/20   Strengthening     SLR 3 x 10; 1# ^9/29   Quadruped alternate UE reaches 3 x 10 Start 9/29   Quadruped alternate LE reaches 3 x 10 Start 9/29; cane as feedback   Quadruped alternate UE/LE reaches 3 x 10 Start 10/2   Ball squats     Ball heel raises     clamshells 3 x 10; silver Start 9/29   Tband lat pulls on SB 3 x 10; silver Start 10/2   Tband rows on SB 3 x 10; silver Start 10/2   TA Iso 10 sec x 10 Start 9/22   TA Iso w/ ADD 10 sec x 10  10 sec x 10 in 90/90 Start 9/22   TA Iso with ABD 3 x 10 silver band; ^9/25   Start 9/22       Manual Intervention             Prone PA      GISTM/STM      Lumbar Manip      SI Manip      Hip belt mobs      Hip LA distraction              Therapeutic Exercise and NMR EXR  [x]  (97110) Provided verbal/tactile cueing for activities related to strengthening, flexibility, endurance, ROM  for improvements in proximal hip and core control with self care, mobility,  lifting and ambulation.  [x]  [phone]) Provided verbal/tactile cueing for activities related to improving balance, coordination, kinesthetic sense, posture, motor skill, proprioception  to assist with core control in self care, mobility, lifting, and ambulation.     Therapeutic Activities:    [x]  [phone] or 09811) Provided verbal/tactile cueing for activities related to improving balance, coordination, kinesthetic sense, posture, motor skill, proprioception and motor activation to allow for proper function  with self care and ADLs  []  [phone]) Provided training and instruction to the patient for proper core and proximal hip recruitment and positioning with ambulation re-education     Home Exercise Program:    [x]  (29562) Reviewed/Progressed HEP activities related to strengthening, flexibility, endurance, ROM of core, proximal hip and LE for functional self-care, mobility, lifting and ambulation   []  (97112) Reviewed/Progressed HEP activities related to improving balance, coordination, kinesthetic sense, posture, motor skill, proprioception of core, proximal hip and LE for self care, mobility, lifting, and ambulation      Manual Treatments:  PROM / STM / Oscillations-Mobs:  G-I, II, III, IV (PA's, Inf., Post.)  []  (97140) Provided manual therapy to mobilize proximal hip and LS spine soft tissue/joints for the purpose of modulating pain, promoting relaxation,  increasing ROM, reducing/eliminating soft tissue swelling/inflammation/restriction, improving soft tissue extensibility and allowing for proper ROM for normal function with self care, mobility, lifting and ambulation.     Modalities:       Charges:  Timed Code Treatment Minutes: 60   Total Treatment  Minutes: 90  1:30- 3:00       []  EVAL (LOW) 97161 (typically 20 minutes face-to-face)  []  EVAL (MOD) [id number] (typically 30 minutes face-to-face)  []  EVAL (HIGH) 97163 (typically 45 minutes face-to-face)  []  RE-EVAL     [x]  WU([id number](97110) x  2   []  IONTO  [x]  NMR (97112) x  1    []  VASO  []  Manual (97140) x       []  Other:  [x]  TA x  1    []  Mech Traction (14782(97012)  []  ES(attended) (95621(97032)      []  ES (un) (30865(97014):     Goals:   Patient stated goal: Reduce pain; improve core strength and posture    Therapist goals for Patient:   Short Term Goals: To be achieved in: 2 weeks  1. Independent in HEP and progression per patient tolerance, in order to prevent re-injury.   2. Patient will have a decrease in pain to facilitate improvement in movement, function, and ADLs as indicated by Functional Deficits.    Long Term Goals: To be achieved in: 3-6 weeks  1. Disability index score of 10% or less for the [place] to assist with reaching prior level of function in 6 weeks.   2. Patient will demonstrate increased AROM to WNL, good LS mobility, good hip ROM to allow for proper joint functioning as indicated by patients Functional Deficits in 4 weeks.   3. Patient will demonstrate an increase in Strength to good proximal hip and core activation to allow for proper functional mobility as indicated by patients Functional Deficits in 6 weeks.   4. Patient will return to independent functional activities without increased symptoms or restriction in 4 weeks.     Progression Towards Functional goals:  [x]  Patient is progressing as expected towards functional goals listed.    []  Progression is slowed due to complexities listed.  []  Progression has been slowed due to co-morbidities.  []  Plan just implemented, too soon to assess goals progression  []  Other:     ASSESSMENT:  See eval    Treatment/Activity Tolerance:  [x]  Patient tolerated treatment well []  Patient limited by fatique  []  Patient limited by pain  []  Patient limited by other medical complications  [x]  Other:  10/2 Patient tolerated treatment well with good tolerance to addition of lat pull downs and rows on stability ball. Patient reported no increase in pain. HEP updated and supplied to patient. Continue to progress as tolerated.      Patient  education:  9/20 HEP provided and reviewed     Prognosis: [x]  Good []  Fair  []  Poor    Patient Requires Follow-up: [x]  Yes  []  No    PLAN:   [x]  Continue per plan of care []  Alter current plan (see comments)  []  Plan of care initiated []  Hold pending MD visit []  Discharge    Electronically signed by: Dillon BjorkSarah Kasara Schomer PT, DPT

## 2016-08-23 ENCOUNTER — Inpatient Hospital Stay: Admit: 2016-08-23 | Primary: Family Medicine

## 2016-08-23 NOTE — Other (Signed)
[facility] ??? [facility], [facility]  [address], [address]    [place], [place] [id number]  Phone: [phone]   Fax:     [phone]    Physical Therapy Daily Treatment Note  Date:  08/23/2016    Patient Name:  Zachary Gregory  "Zachary Gregory"  DOB:  08/23/1981  MRN: [id number]  Restrictions/Precautions:    Medical/Treatment Diagnosis Information:  Diagnosis: S39.012 Lumbar Strain  Treatment Diagnosis: M45.5 Pain in Lumbar Spine  Insurance/Certification information:  [facility]  Physician Information:  Referring Practitioner: Alyce Paganimothy J Linker  Plan of care signed (Y/N):     Date of Patient follow up with Physician:     G-Code (if applicable):      Date G-Code Applied:  08/07/16 [place]: 14% deficit       Progress Note: []   Yes  [x]   No  Next due by: Visit #10  Or 09/06/16    Latex Allergy:  [x] NO      [] YES  Preferred Language for Healthcare:   [x] English       [] other:    Visit # Insurance Allowable   6  10/6 120     Pain level:  3-4/10  10/6     SUBJECTIVE:  10/6 Patient states that he is little stiff today and had pain on Wednesday that he rates a 6/10 but he states he felt pretty good on Thursday and is a dull achy pain today.       OBJECTIVE:   ROM  9/20  Comments   Trunk flexion WNL Pain present   Trunk extension WNL    Trunk R sidebend WNL    Trunk L sidebend WNL    Trunk R rotation WNL    Trunk L rotation WNL    HS flexibility                 Strength  9/20 Left Right Comments   Hip flexion(L2) 5 5    Knee extension(L3) 5 5    Knee flexion(S1-2) 5 5 Pain in back with MMT   Ankle dorsiflexion(L4) 5 5    Toe extension(L5) 5 5    Ankle eversion/plantar flexion(S1) 5 5      Special tests 9/20  Comments   SLR negative    Slump test     Pelvic symmetry  R higher than left    Segmental Spinal mobility     Heel walk negative    Toe walk negative    Tandem walk            DTR???s  9/20 Left Right Comments   Patellar(L3-L4) = =    Achilles(S1-S2) = =            Joint  mobility: 9/20   [x] Normal    [] Hypo   [] Hyper    Palpation: Tenderness bilateral PSIS  9/20    Functional Mobility/Transfers: Independent with increased pain   9/20    Posture: Forward head and rounded shoulders; decreased lumbar lordosis in sitting  9/20    Gait: (include devices/WB status) WNL  9/20                         [x]  Patient history, allergies, meds reviewed. Medical chart reviewed. See intake form.  9/20    Review Of Systems (ROS):  [x] Performed Review of systems (Integumentary, CardioPulmonary, Neurological) by intake and observation. Intake form has been scanned into  medical record. Patient has been instructed to contact their primary care physician regarding ROS issues if not already being addressed at this time.  9/20    RESTRICTIONS/PRECAUTIONS: None    Exercises/Interventions: BILATERAL  ROM/stretches     SKTC 30 sec x 5 Start 9/20   DKTC     Prayer stretch     Supine HS 30 sec x 5 Start 9/20   Pelvic tilt     Hook lying rotation 10 sec x 10 each side  Start 9/22   Cat and camel     Piriformis 30 sec x 5 Start 9/20   Strengthening     SLR 3 x 10; 1# ^9/29   SLR ABD 3 x 10; 1# Start 10/6   Quadruped alternate UE reaches 3 x 10 Start 9/29   Quadruped alternate LE reaches 3 x 10 Start 9/29; cane as feedback   Quadruped alternate UE/LE reaches 3 x 10 Start 10/2   Opposite UE and LE march on ball 30x Start 10/6   Ball heel raises     TA Iso in 90/90 with UE flexion 3 x 10 4# ball; ^9/28   clamshells 3 x 10; silver Start 9/29   Tband lat pulls on SB 3 x 10; silver Start 10/2   Tband rows on SB 3 x 10; silver Start 10/2   TA Iso 10 sec x 10 Start 9/22   TA Iso w/ ADD 10 sec x 10  10 sec x 10 in 90/90 Start 9/22   TA Iso with ABD 3 x 10 silver band; ^9/25   TA Iso w/ heel slide (90/90) 30x  Start 9/22       Manual Intervention             Prone PA      GISTM/STM      Lumbar Manip      SI Manip      Hip belt mobs      Hip LA distraction              Therapeutic Exercise and NMR EXR  [x]  (97110) Provided  verbal/tactile cueing for activities related to strengthening, flexibility, endurance, ROM  for improvements in proximal hip and core control with self care, mobility, lifting and ambulation.  [x]  [phone]) Provided verbal/tactile cueing for activities related to improving balance, coordination, kinesthetic sense, posture, motor skill, proprioception  to assist with core control in self care, mobility, lifting, and ambulation.     Therapeutic Activities:    [x]  [phone] or [id number]) Provided verbal/tactile cueing for activities related to improving balance, coordination, kinesthetic sense, posture, motor skill, proprioception and motor activation to allow for proper function  with self care and ADLs  []  [phone]) Provided training and instruction to the patient for proper core and proximal hip recruitment and positioning with ambulation re-education     Home Exercise Program:    [x]  (56433) Reviewed/Progressed HEP activities related to strengthening, flexibility, endurance, ROM of core, proximal hip and LE for functional self-care, mobility, lifting and ambulation   []  (29518) Reviewed/Progressed HEP activities related to improving balance, coordination, kinesthetic sense, posture, motor skill, proprioception of core, proximal hip and LE for self care, mobility, lifting, and ambulation      Manual Treatments:  PROM / STM / Oscillations-Mobs:  G-I, II, III, IV (PA's, Inf., Post.)  []  (97140) Provided manual therapy to mobilize proximal hip and LS spine soft tissue/joints for the purpose of modulating pain, promoting relaxation,  increasing  ROM, reducing/eliminating soft tissue swelling/inflammation/restriction, improving soft tissue extensibility and allowing for proper ROM for normal function with self care, mobility, lifting and ambulation.     Modalities:       Charges:  Timed Code Treatment Minutes: 60   Total Treatment Minutes: 90  8:30- 10:00       []  EVAL (LOW) 97161 (typically 20 minutes face-to-face)  []  EVAL (MOD)  97162 (typically 30 minutes face-to-face)  []  EVAL (HIGH) 97163 (typically 45 minutes face-to-face)  []  RE-EVAL     [x]  WU(98119) x  2   []  IONTO  [x]  NMR (14782) x  1   []  VASO  []  Manual (97140) x       []  Other:  [x]  TA x  1    []  Mech Traction (95621)  []  ES(attended) (30865)      []  ES (un) (78469):     Goals:   Patient stated goal: Reduce pain; improve core strength and posture    Therapist goals for Patient:   Short Term Goals: To be achieved in: 2 weeks  1. Independent in HEP and progression per patient tolerance, in order to prevent re-injury.   2. Patient will have a decrease in pain to facilitate improvement in movement, function, and ADLs as indicated by Functional Deficits.    Long Term Goals: To be achieved in: 3-6 weeks  1. Disability index score of 10% or less for the [place] to assist with reaching prior level of function in 6 weeks.   2. Patient will demonstrate increased AROM to WNL, good LS mobility, good hip ROM to allow for proper joint functioning as indicated by patients Functional Deficits in 4 weeks.   3. Patient will demonstrate an increase in Strength to good proximal hip and core activation to allow for proper functional mobility as indicated by patients Functional Deficits in 6 weeks.   4. Patient will return to independent functional activities without increased symptoms or restriction in 4 weeks.     Progression Towards Functional goals:  [x]  Patient is progressing as expected towards functional goals listed.    []  Progression is slowed due to complexities listed.  []  Progression has been slowed due to co-morbidities.  []  Plan just implemented, too soon to assess goals progression  []  Other:     ASSESSMENT:  See eval    Treatment/Activity Tolerance:  [x]  Patient tolerated treatment well []  Patient limited by fatique  []  Patient limited by pain  []  Patient limited by other medical complications  [x]  Other:  10/6 Patient tolerated treatment well this session with fatigue at end of  session. Patient challenged and fatigued by program but able to complete. Discussed returning 1x a week and see how patient's symptoms respond. Monitor response next week. Continue to progress as tolerated.   Patient education:  9/20 HEP provided and reviewed     Prognosis: [x]  Good []  Fair  []  Poor    Patient Requires Follow-up: [x]  Yes  []  No    PLAN:   [x]  Continue per plan of care []  Alter current plan (see comments)  []  Plan of care initiated []  Hold pending MD visit []  Discharge    Electronically signed by: Dillon Bjork PT, DPT

## 2016-08-28 ENCOUNTER — Inpatient Hospital Stay: Admit: 2016-08-28 | Primary: Family Medicine

## 2016-08-28 NOTE — Other (Signed)
[facility] ??? Orthopaedics and [facility], [facility]  [address], [address]    [place], [place] [address]  Phone: [phone]   Fax:     [phone]    Physical Therapy Daily Treatment Note  Date:  08/28/2016    Patient Name:  Zachary Gregory  "[name]"  DOB:  Feb 08, 1981  MRN: [id number]  Restrictions/Precautions:    Medical/Treatment Diagnosis Information:  Diagnosis: S39.012 Lumbar Strain  Treatment Diagnosis: M45.5 Pain in Lumbar Spine  Insurance/Certification information:  PT Insurance Information: [facility]  Physician Information:  Referring Practitioner: Alyce Pagan  Plan of care signed (Y/N):     Date of Patient follow up with Physician:     G-Code (if applicable):      Date G-Code Applied:  08/07/16 [place]: 14% deficit       Progress Note: []   Yes  [x]   No  Next due by: Visit #10  Or 09/06/16    Latex Allergy:  [x] NO      [] YES  Preferred Language for Healthcare:   [x] English       [] other:    Visit # Insurance Allowable   7  10/11 120     Pain level:  2/10  10/11     SUBJECTIVE:  10/11 Patient states that he went to the chiropractor on Friday was sore this past weekend. He states that he felt pretty good all weekend. He states that he has been working all weekend and was stiff this morning but feels a little better.       OBJECTIVE:   ROM  9/20  Comments   Trunk flexion WNL Pain present   Trunk extension WNL    Trunk R sidebend WNL    Trunk L sidebend WNL    Trunk R rotation WNL    Trunk L rotation WNL    HS flexibility                 Strength  9/20 Left Right Comments   Hip flexion(L2) 5 5    Knee extension(L3) 5 5    Knee flexion(S1-2) 5 5 Pain in back with MMT   Ankle dorsiflexion(L4) 5 5    Toe extension(L5) 5 5    Ankle eversion/plantar flexion(S1) 5 5      Special tests 9/20  Comments   SLR negative    Slump test     Pelvic symmetry  R higher than left    Segmental Spinal mobility     Heel walk negative    Toe walk negative    Tandem walk            DTR???s  9/20 Left Right  Comments   Patellar(L3-L4) = =    Achilles(S1-S2) = =            Joint mobility: 9/20   [x] Normal    [] Hypo   [] Hyper    Palpation: Tenderness bilateral PSIS  9/20    Functional Mobility/Transfers: Independent with increased pain   9/20    Posture: Forward head and rounded shoulders; decreased lumbar lordosis in sitting  9/20    Gait: (include devices/WB status) WNL  9/20                         [x]  Patient history, allergies, meds reviewed. Medical chart reviewed. See intake form.  9/20    Review Of Systems (ROS):  [x] Performed Review of systems (Integumentary, CardioPulmonary, Neurological) by  intake and observation. Intake form has been scanned into medical record. Patient has been instructed to contact their primary care physician regarding ROS issues if not already being addressed at this time.  9/20    RESTRICTIONS/PRECAUTIONS: None    Exercises/Interventions: BILATERAL  ROM/stretches     SKTC 30 sec x 5 Start 9/20   DKTC     Prayer stretch     Supine HS 30 sec x 5 Start 9/20   Pelvic tilt     Hook lying rotation 10 sec x 10 each side  Start 9/22   Cat and camel     Piriformis 30 sec x 5 Start 9/20   Strengthening     SLR 3 x 10; 1.5# ^10/11   SLR ABD 3 x 10; 1.5# ^10/11   Start 9/29   Start 9/29; cane as feedback   Quadruped alternate UE/LE reaches 3 x 10 Start 10/2   Opposite UE and LE march on ball 30x Start 10/6   Paloff press 3 x 10 each side Black; start 10/11   TA Iso in 90/90 with UE flexion 3 x 10 7# ball; w/ ball squeeze;  ^10/11   clamshells 3 x 10; purple ^10/11   Tband lat pulls on SB 3 x 10; silver Start 10/2   Tband rows on SB 3 x 10; silver Start 10/2   Start 9/22   TA Iso w/ ADD   10 sec x 10 in 90/90 Start 9/22   TA Iso with ABD 3 x 10 Purple band;  ^10/11   TA Iso w/ heel slide (90/90) 30x  1.5#; ^10/11   Bridges 3 x 10 SB; start 10/11       Manual Intervention             Prone PA      GISTM/STM      Lumbar Manip      SI Manip      Hip belt mobs      Hip LA distraction               Therapeutic Exercise and NMR EXR  [x]  (97110) Provided verbal/tactile cueing for activities related to strengthening, flexibility, endurance, ROM  for improvements in proximal hip and core control with self care, mobility, lifting and ambulation.  [x]  [phone](97112) Provided verbal/tactile cueing for activities related to improving balance, coordination, kinesthetic sense, posture, motor skill, proprioception  to assist with core control in self care, mobility, lifting, and ambulation.     Therapeutic Activities:    [x]  [phone](97112 or [phone]) Provided verbal/tactile cueing for activities related to improving balance, coordination, kinesthetic sense, posture, motor skill, proprioception and motor activation to allow for proper function  with self care and ADLs  []  [phone](97116) Provided training and instruction to the patient for proper core and proximal hip recruitment and positioning with ambulation re-education     Home Exercise Program:    [x]  [phone](97110) Reviewed/Progressed HEP activities related to strengthening, flexibility, endurance, ROM of core, proximal hip and LE for functional self-care, mobility, lifting and ambulation   []  (84696(97112) Reviewed/Progressed HEP activities related to improving balance, coordination, kinesthetic sense, posture, motor skill, proprioception of core, proximal hip and LE for self care, mobility, lifting, and ambulation      Manual Treatments:  PROM / STM / Oscillations-Mobs:  G-I, II, III, IV (PA's, Inf., Post.)  []  (97140) Provided manual therapy to mobilize proximal hip and LS spine soft tissue/joints for the purpose of modulating pain, promoting  relaxation,  increasing ROM, reducing/eliminating soft tissue swelling/inflammation/restriction, improving soft tissue extensibility and allowing for proper ROM for normal function with self care, mobility, lifting and ambulation.     Modalities:   Ice 10 min  Start 10/11    Charges:  Timed Code Treatment Minutes: 60   Total Treatment Minutes: 80  4:55-  6:15       []  EVAL (LOW) 97161 (typically 20 minutes face-to-face)  []  EVAL (MOD) [address] (typically 30 minutes face-to-face)  []  EVAL (HIGH) 97163 (typically 45 minutes face-to-face)  []  RE-EVAL     [x]  UE(45409) x  2   []  IONTO  [x]  NMR (97112) x  1   []  VASO  []  Manual (97140) x       []  Other:  [x]  TA x  1    []  Mech Traction (81191)  []  ES(attended) (47829)      []  ES (un) (56213):     Goals:   Patient stated goal: Reduce pain; improve core strength and posture    Therapist goals for Patient:   Short Term Goals: To be achieved in: 2 weeks  1. Independent in HEP and progression per patient tolerance, in order to prevent re-injury.   2. Patient will have a decrease in pain to facilitate improvement in movement, function, and ADLs as indicated by Functional Deficits.    Long Term Goals: To be achieved in: 3-6 weeks  1. Disability index score of 10% or less for the [place] to assist with reaching prior level of function in 6 weeks.   2. Patient will demonstrate increased AROM to WNL, good LS mobility, good hip ROM to allow for proper joint functioning as indicated by patients Functional Deficits in 4 weeks.   3. Patient will demonstrate an increase in Strength to good proximal hip and core activation to allow for proper functional mobility as indicated by patients Functional Deficits in 6 weeks.   4. Patient will return to independent functional activities without increased symptoms or restriction in 4 weeks.     Progression Towards Functional goals:  [x]  Patient is progressing as expected towards functional goals listed.    []  Progression is slowed due to complexities listed.  []  Progression has been slowed due to co-morbidities.  []  Plan just implemented, too soon to assess goals progression  []  Other:     ASSESSMENT:  See eval    Treatment/Activity Tolerance:  [x]  Patient tolerated treatment well []  Patient limited by fatique  []  Patient limited by pain  []  Patient limited by other medical complications  [x]  Other:   10/11 Patient tolerated treatment well this session with no reports of pain or discomfort. Good tolerance to addition of bridges and paloff press. Good tolerance and fatigue with progression of core strengthening exercises. Continue to progress as tolerated.     Patient education:  9/20 HEP provided and reviewed     Prognosis: [x]  Good []  Fair  []  Poor    Patient Requires Follow-up: [x]  Yes  []  No    PLAN:   [x]  Continue per plan of care []  Alter current plan (see comments)  []  Plan of care initiated []  Hold pending MD visit []  Discharge    Electronically signed by: Dillon Bjork PT, DPT

## 2016-09-04 ENCOUNTER — Inpatient Hospital Stay: Admit: 2016-09-04 | Primary: Family Medicine

## 2016-09-04 NOTE — Other (Signed)
[facility] ??? Orthopaedics and Sports Rehabilitation, [facility]  [address]    [place], [place] [address]  Phone: [phone]   Fax:     [phone]    Physical Therapy Daily Treatment Note  Date:  09/04/2016    Patient Name:  Zachary Gregory  "Zachary Gregory"  DOB:  12-19-1980  MRN: [id number]  Restrictions/Precautions:    Medical/Treatment Diagnosis Information:  Diagnosis: S39.012 Lumbar Strain  Treatment Diagnosis: M45.5 Pain in Lumbar Spine  Insurance/Certification information:  PT Insurance Information: [facility]  Physician Information:  Referring Practitioner: Alyce Pagan  Plan of care signed (Y/N):     Date of Patient follow up with Physician:     G-Code (if applicable):      Date G-Code Applied:  08/07/16 [place]: 14% deficit       Progress Note: []   Yes  [x]   No  Next due by: Visit #10  Or 09/06/16    Latex Allergy:  [x] NO      [] YES  Preferred Language for Healthcare:   [x] English       [] other:    Visit # Insurance Allowable   8  10/18 120     Pain level:  2/10  10/18     SUBJECTIVE:  10/18 Patient states that his back has been really good lately. He states that he notices his back gets sore after 8-9 hours at work but otherwise good. He states that his knee and glutes are sore and stiff.       OBJECTIVE:   ROM  9/20  Comments   Trunk flexion WNL Pain present   Trunk extension WNL    Trunk R sidebend WNL    Trunk L sidebend WNL    Trunk R rotation WNL    Trunk L rotation WNL    HS flexibility                 Strength  9/20 Left Right Comments   Hip flexion(L2) 5 5    Knee extension(L3) 5 5    Knee flexion(S1-2) 5 5 Pain in back with MMT   Ankle dorsiflexion(L4) 5 5    Toe extension(L5) 5 5    Ankle eversion/plantar flexion(S1) 5 5      Special tests 9/20  Comments   SLR negative    Slump test     Pelvic symmetry  R higher than left    Segmental Spinal mobility     Heel walk negative    Toe walk negative    Tandem walk            DTR???s  9/20 Left Right Comments   Patellar(L3-L4) = =     Achilles(S1-S2) = =            Joint mobility: 9/20   [x] Normal    [] Hypo   [] Hyper    Palpation: Tenderness bilateral PSIS  9/20    Functional Mobility/Transfers: Independent with increased pain   9/20    Posture: Forward head and rounded shoulders; decreased lumbar lordosis in sitting  9/20    Gait: (include devices/WB status) WNL  9/20                         [x]  Patient history, allergies, meds reviewed. Medical chart reviewed. See intake form.  9/20    Review Of Systems (ROS):  [x] Performed Review of systems (Integumentary, CardioPulmonary, Neurological) by intake and observation. Intake form  has been scanned into medical record. Patient has been instructed to contact their primary care physician regarding ROS issues if not already being addressed at this time.  9/20    RESTRICTIONS/PRECAUTIONS: None    Exercises/Interventions: BILATERAL  ROM/stretches     SKTC 30 sec x 5 Start 9/20   DKTC     Prayer stretch     Supine HS 30 sec x 5 Start 9/20   Pelvic tilt     Hook lying rotation 10 sec x 10 each side  Start 9/22   Cat and camel     Piriformis 30 sec x 5 Start 9/20   Strengthening     SLR 3 x 10; 2# ^10/18   SLR ABD 3 x 10; 2# ^10/18   Start 9/29   Start 9/29; cane as feedback   Quadruped alternate UE/LE reaches 3 x 10 Start 10/2   Opposite UE and LE march on ball 30x Start 10/6   Paloff press 3 x 10 each side Black; start 10/11   TA Iso in 90/90 with UE flexion 3 x 10 7# ball; w/ ball squeeze;  ^10/11   clamshells 3 x 10; purple ^10/11   Tband lat pulls on SB 3 x 10; silver Start 10/2   Tband rows on SB 3 x 10; silver Start 10/2   Start 9/22   TA Iso w/ ADD   10 sec x 10 in 90/90 Start 9/22   TA Iso with ABD 3 x 10 Purple band;  ^10/11   TA Iso w/ heel slide (90/90) 30x  1.5#; ^10/11   Bridges 3 x 10 SB; start 10/11   Planks 3 x 30 sec  Start 10/18       Manual Intervention             Prone PA      GISTM/STM      Lumbar Manip      SI Manip      Hip belt mobs      Hip LA distraction      Rolling Posterior  thigh and glute 8 min  Start 10/18     Therapeutic Exercise and NMR EXR  [x]  (97110) Provided verbal/tactile cueing for activities related to strengthening, flexibility, endurance, ROM  for improvements in proximal hip and core control with self care, mobility, lifting and ambulation.  [x]  [phone](97112) Provided verbal/tactile cueing for activities related to improving balance, coordination, kinesthetic sense, posture, motor skill, proprioception  to assist with core control in self care, mobility, lifting, and ambulation.     Therapeutic Activities:    [x]  [phone](97112 or [phone]) Provided verbal/tactile cueing for activities related to improving balance, coordination, kinesthetic sense, posture, motor skill, proprioception and motor activation to allow for proper function  with self care and ADLs  []  [phone](97116) Provided training and instruction to the patient for proper core and proximal hip recruitment and positioning with ambulation re-education     Home Exercise Program:    [x]  (65784(97110) Reviewed/Progressed HEP activities related to strengthening, flexibility, endurance, ROM of core, proximal hip and LE for functional self-care, mobility, lifting and ambulation   []  (69629(97112) Reviewed/Progressed HEP activities related to improving balance, coordination, kinesthetic sense, posture, motor skill, proprioception of core, proximal hip and LE for self care, mobility, lifting, and ambulation      Manual Treatments:  PROM / STM / Oscillations-Mobs:  G-I, II, III, IV (PA's, Inf., Post.)  []  (97140) Provided manual therapy to mobilize proximal hip and  LS spine soft tissue/joints for the purpose of modulating pain, promoting relaxation,  increasing ROM, reducing/eliminating soft tissue swelling/inflammation/restriction, improving soft tissue extensibility and allowing for proper ROM for normal function with self care, mobility, lifting and ambulation.     Modalities:   Ice 10 min  Start 10/11    Charges:  Timed Code Treatment Minutes: 55    Total Treatment Minutes: 70  4:40- 5:50       []  EVAL (LOW) 97161 (typically 20 minutes face-to-face)  []  EVAL (MOD) 54098 (typically 30 minutes face-to-face)  []  EVAL (HIGH) 97163 (typically 45 minutes face-to-face)  []  RE-EVAL     [x]  JX(91478) x  1   []  IONTO  [x]  NMR (97112) x  1   []  VASO  [x]  Manual (97140) x  1    []  Other:  [x]  TA x  1    []  Mech Traction (29562)  []  ES(attended) (13086)      []  ES (un) (57846):     Goals:   Patient stated goal: Reduce pain; improve core strength and posture    Therapist goals for Patient:   Short Term Goals: To be achieved in: 2 weeks  1. Independent in HEP and progression per patient tolerance, in order to prevent re-injury.   2. Patient will have a decrease in pain to facilitate improvement in movement, function, and ADLs as indicated by Functional Deficits.    Long Term Goals: To be achieved in: 3-6 weeks  1. Disability index score of 10% or less for the [place] to assist with reaching prior level of function in 6 weeks.   2. Patient will demonstrate increased AROM to WNL, good LS mobility, good hip ROM to allow for proper joint functioning as indicated by patients Functional Deficits in 4 weeks.   3. Patient will demonstrate an increase in Strength to good proximal hip and core activation to allow for proper functional mobility as indicated by patients Functional Deficits in 6 weeks.   4. Patient will return to independent functional activities without increased symptoms or restriction in 4 weeks.     Progression Towards Functional goals:  [x]  Patient is progressing as expected towards functional goals listed.    []  Progression is slowed due to complexities listed.  []  Progression has been slowed due to co-morbidities.  []  Plan just implemented, too soon to assess goals progression  []  Other:     ASSESSMENT:  See eval    Treatment/Activity Tolerance:  [x]  Patient tolerated treatment well []  Patient limited by fatique  []  Patient limited by pain  []  Patient limited by  other medical complications  [x]  Other:  10/18 Patient tolerated treatment well this session with fatigue at end of session. Patient challenged by plank exercise but good tolerance. Good tolerance to exercise program. Positive response to rolling with stick. Continue to progress as tolerated.     Patient education:  9/20 HEP provided and reviewed     Prognosis: [x]  Good []  Fair  []  Poor    Patient Requires Follow-up: [x]  Yes  []  No    PLAN:   [x]  Continue per plan of care []  Alter current plan (see comments)  []  Plan of care initiated []  Hold pending MD visit []  Discharge    Electronically signed by: Dillon Bjork PT, DPT

## 2016-09-11 ENCOUNTER — Inpatient Hospital Stay: Admit: 2016-09-11 | Primary: Family Medicine

## 2016-09-11 NOTE — Other (Signed)
[facility] ??? Orthopaedics and Sports Rehabilitation, [facility]  [address]    [place], [place] [address]  Phone: [phone]   Fax:     [phone]    Physical Therapy Daily Treatment Note  Date:  09/11/2016    Patient Name:  Zachary Gregory  "Zachary Gregory"  DOB:  [birth year]  MRN: [id number]  Restrictions/Precautions:    Medical/Treatment Diagnosis Information:  Diagnosis: S39.012 Lumbar Strain  Treatment Diagnosis: M45.5 Pain in Lumbar Spine  Insurance/Certification information:  PT Insurance Information: [facility]  Physician Information:  Referring Practitioner: Alyce Pagan  Plan of care signed (Y/N):     Date of Patient follow up with Physician:     G-Code (if applicable):      Date G-Code Applied:  08/07/16 [place]: 14% deficit       Progress Note: []   Yes  [x]   No  Next due by: Visit #10  Or 09/06/16    Latex Allergy:  [x] NO      [] YES  Preferred Language for Healthcare:   [x] English       [] other:    Visit # Insurance Allowable   9  10/25 120     Pain level:  2/10  10/18     SUBJECTIVE:  10/25 Patient states that he is tired and work has been crazy.       OBJECTIVE:   ROM  9/20  Comments   Trunk flexion WNL Pain present   Trunk extension WNL    Trunk R sidebend WNL    Trunk L sidebend WNL    Trunk R rotation WNL    Trunk L rotation WNL    HS flexibility                 Strength  9/20 Left Right Comments   Hip flexion(L2) 5 5    Knee extension(L3) 5 5    Knee flexion(S1-2) 5 5 Pain in back with MMT   Ankle dorsiflexion(L4) 5 5    Toe extension(L5) 5 5    Ankle eversion/plantar flexion(S1) 5 5      Special tests 9/20  Comments   SLR negative    Slump test     Pelvic symmetry  R higher than left    Segmental Spinal mobility     Heel walk negative    Toe walk negative    Tandem walk            DTR???s  9/20 Left Right Comments   Patellar(L3-L4) = =    Achilles(S1-S2) = =            Joint mobility: 9/20   [x] Normal    [] Hypo   [] Hyper    Palpation: Tenderness bilateral PSIS  9/20    Functional  Mobility/Transfers: Independent with increased pain   9/20    Posture: Forward head and rounded shoulders; decreased lumbar lordosis in sitting  9/20    Gait: (include devices/WB status) WNL  9/20                         [x]  Patient history, allergies, meds reviewed. Medical chart reviewed. See intake form.  9/20    Review Of Systems (ROS):  [x] Performed Review of systems (Integumentary, CardioPulmonary, Neurological) by intake and observation. Intake form has been scanned into medical record. Patient has been instructed to contact their primary care physician regarding ROS issues if not already being addressed at this time.  9/20    RESTRICTIONS/PRECAUTIONS: None    Exercises/Interventions: BILATERAL  ROM/stretches     SKTC 30 sec x 5 Start 9/20   DKTC     Prayer stretch     Supine HS 30 sec x 5 Start 9/20   Pelvic tilt     Hook lying rotation 10 sec x 10 each side  Start 9/22   Cat and camel     Piriformis 30 sec x 5 Start 9/20   Strengthening     SLR 3 x 10; 2# ^10/18   SLR ABD 3 x 10; 2# ^10/18   Start 9/29   Start 9/29; cane as feedback   Quadruped alternate UE/LE reaches 3 x 10 Start 10/2   Start 10/6   Black; start 10/11   TA Iso in 90/90 with UE flexion 3 x 10 7# ball; w/ ball squeeze;  ^10/11   clamshells 3 x 10; purple ^10/11   Start 10/2   Start 10/2   Start 9/22   TA Iso w/ ADD 10 sec x 10  10 sec x 10 in 90/90 Start 9/22   TA Iso with ABD 3 x 10 Purple band;  ^10/11   TA Iso w/ heel slide (90/90) 30x  1.5#; ^10/11   Bridges 3 x 10 (5 second hold) SB; ^10/25   Planks 3 x 30 sec  Start 10/18       Manual Intervention             Prone PA      GISTM/STM      Lumbar Manip      SI Manip      Hip belt mobs      Hip LA distraction      Rolling Posterior thigh and glute 8 min  Start 10/18     Therapeutic Exercise and NMR EXR  [x]  (97110) Provided verbal/tactile cueing for activities related to strengthening, flexibility, endurance, ROM  for improvements in proximal hip and core control with self care, mobility,  lifting and ambulation.  [x]  [phone]) Provided verbal/tactile cueing for activities related to improving balance, coordination, kinesthetic sense, posture, motor skill, proprioception  to assist with core control in self care, mobility, lifting, and ambulation.     Therapeutic Activities:    [x]  [phone] or 09811) Provided verbal/tactile cueing for activities related to improving balance, coordination, kinesthetic sense, posture, motor skill, proprioception and motor activation to allow for proper function  with self care and ADLs  []  [phone]) Provided training and instruction to the patient for proper core and proximal hip recruitment and positioning with ambulation re-education     Home Exercise Program:    [x]  (29562) Reviewed/Progressed HEP activities related to strengthening, flexibility, endurance, ROM of core, proximal hip and LE for functional self-care, mobility, lifting and ambulation   []  (13086) Reviewed/Progressed HEP activities related to improving balance, coordination, kinesthetic sense, posture, motor skill, proprioception of core, proximal hip and LE for self care, mobility, lifting, and ambulation      Manual Treatments:  PROM / STM / Oscillations-Mobs:  G-I, II, III, IV (PA's, Inf., Post.)  []  (97140) Provided manual therapy to mobilize proximal hip and LS spine soft tissue/joints for the purpose of modulating pain, promoting relaxation,  increasing ROM, reducing/eliminating soft tissue swelling/inflammation/restriction, improving soft tissue extensibility and allowing for proper ROM for normal function with self care, mobility, lifting and ambulation.     Modalities:   Ice 10 min  Start 10/11    Charges:  Timed Code Treatment Minutes: 55   Total Treatment Minutes: 90  4:40- 6:10       []  EVAL (LOW) 97161 (typically 20 minutes face-to-face)  []  EVAL (MOD) [id number] (typically 30 minutes face-to-face)  []  EVAL (HIGH) 97163 (typically 45 minutes face-to-face)  []  RE-EVAL     [x]  [id number](97110) x  1   []   IONTO  [x]  NMR (97112) x  1   []  VASO  [x]  Manual (97140) x  1    []  Other:  [x]  TA x  1    []  Mech Traction (56213(97012)  []  ES(attended) (08657(97032)      []  ES (un) (84696(97014):     Goals:   Patient stated goal: Reduce pain; improve core strength and posture    Therapist goals for Patient:   Short Term Goals: To be achieved in: 2 weeks  1. Independent in HEP and progression per patient tolerance, in order to prevent re-injury.   2. Patient will have a decrease in pain to facilitate improvement in movement, function, and ADLs as indicated by Functional Deficits.    Long Term Goals: To be achieved in: 3-6 weeks  1. Disability index score of 10% or less for the [place] to assist with reaching prior level of function in 6 weeks.   2. Patient will demonstrate increased AROM to WNL, good LS mobility, good hip ROM to allow for proper joint functioning as indicated by patients Functional Deficits in 4 weeks.   3. Patient will demonstrate an increase in Strength to good proximal hip and core activation to allow for proper functional mobility as indicated by patients Functional Deficits in 6 weeks.   4. Patient will return to independent functional activities without increased symptoms or restriction in 4 weeks.     Progression Towards Functional goals:  [x]  Patient is progressing as expected towards functional goals listed.    []  Progression is slowed due to complexities listed.  []  Progression has been slowed due to co-morbidities.  []  Plan just implemented, too soon to assess goals progression  []  Other:     ASSESSMENT:  See eval    Treatment/Activity Tolerance:  [x]  Patient tolerated treatment well []  Patient limited by fatique  []  Patient limited by pain  []  Patient limited by other medical complications  [x]  Other:  10/25 Good tolerance to exercises this session. Patient reported feeling good at end of session. Left LE more tender with rolling than R. Continue to progress as tolerated.     Patient education:  9/20 HEP provided and  reviewed     Prognosis: [x]  Good []  Fair  []  Poor    Patient Requires Follow-up: [x]  Yes  []  No    PLAN:   [x]  Continue per plan of care []  Alter current plan (see comments)  []  Plan of care initiated []  Hold pending MD visit []  Discharge    Electronically signed by: Dillon BjorkSarah Reginal Wojcicki PT, DPT

## 2016-09-18 ENCOUNTER — Encounter: Primary: Family Medicine

## 2016-09-20 ENCOUNTER — Inpatient Hospital Stay: Admit: 2016-09-20 | Primary: Family Medicine

## 2016-09-20 NOTE — Progress Notes (Signed)
[facility] ??? Orthopaedics and Sports Rehabilitation, [address], [place] [address]  Phone: [phone]   Fax:     [phone]    Physical Therapy Daily Treatment Note  Date:  09/20/2016    Patient Name:  Kirtan Sada  [name]  DOB:  Apr 10, 1981  MRN: [id number]  Restrictions/Precautions:    Medical/Treatment Diagnosis Information:  Diagnosis: S39.012 Lumbar Strain  Treatment Diagnosis: M45.5 Pain in Lumbar Spine  Insurance/Certification information:  PT Insurance Information: [facility]  Physician Information:  Referring Practitioner: Mart Piggs  Plan of care signed (Y/N):     Date of Patient follow up with Physician:     G-Code (if applicable):      Date G-Code Applied:  09/20/16 [place]: 7% deficit  PT G-Codes  Functional Assessment Tool Used: Reunion  Score: 7% deficit  Functional Limitation: Changing and maintaining body position  Changing and Maintaining Body Position Current Status [phone]): At least 1 percent but less than 20 percent impaired, limited or restricted  Changing and Maintaining Body Position Goal Status (B8466): 0 percent impaired, limited or restricted    Progress Note: [x]   Yes  []   No  Next due by: Visit #20  Or 10/20/16    Latex Allergy:  [x] NO      [] YES  Preferred Language for Healthcare:   [x] English       [] other:    Visit # Insurance Allowable   10  11/3 120     Pain level:  3/10  11/3     SUBJECTIVE:  11/3 Patient states that his knee is bothering him. He states that the cold seems to make it worse.       OBJECTIVE:   ROM 11/3  Comments   Trunk flexion WNL Pain present   Trunk extension WNL    Trunk R sidebend WNL    Trunk L sidebend WNL    Trunk R rotation WNL    Trunk L rotation WNL    HS flexibility                 Strength  11/3 Left Right Comments   Hip flexion(L2) 5 5    Knee extension(L3) 5 5    Knee flexion(S1-2) 5 5 Pain in back with MMT   Ankle dorsiflexion(L4) 5 5    Toe extension(L5) 5 5    Ankle eversion/plantar flexion(S1) 5 5       Special tests 9/20  Comments   SLR negative    Slump test     Pelvic symmetry  R higher than left    Segmental Spinal mobility     Heel walk negative    Toe walk negative    Tandem walk            DTR???s  11/3 Left Right Comments   Patellar(L3-L4) = =    Achilles(S1-S2) = =            Joint mobility: 11/3   [x] Normal    [] Hypo   [] Hyper    Palpation: Tenderness bilateral PSIS  9/20    Functional Mobility/Transfers: Independent with increased pain   9/20    Posture: Forward head and rounded shoulders; decreased lumbar lordosis in sitting  9/20    Gait: (include devices/WB status) WNL  9/20                         [x]   Patient history, allergies, meds reviewed. Medical chart reviewed. See intake form.  9/20    Review Of Systems (ROS):  [x] Performed Review of systems (Integumentary, CardioPulmonary, Neurological) by intake and observation. Intake form has been scanned into medical record. Patient has been instructed to contact their primary care physician regarding ROS issues if not already being addressed at this time.  9/20    RESTRICTIONS/PRECAUTIONS: None    Exercises/Interventions: BILATERAL  ROM/stretches     SKTC 30 sec x 5 Start 9/20   DKTC     Prayer stretch     Supine HS 30 sec x 5 Start 9/20   Pelvic tilt     Hook lying rotation 10 sec x 10 each side  Start 9/22   Crossbody stretch (ITB) 30 sec x 5 Start 11/3   Piriformis 30 sec x 5 Start 9/20   Strengthening     SLR 3 x 10; 2.5# ^11/3   SLR ABD 3 x 10; 2.5# ^11/3   Start 9/29   Start 9/29; cane as feedback   Quadruped alternate UE/LE reaches 3 x 10 Start 10/2   Start 10/6   Black; start 10/11   TA Iso in 90/90 with UE flexion 3 x 10 7# ball; w/ ball squeeze;  ^10/11   clamshells 3 x 10; purple ^10/11   Start 10/2   Start 10/2   Start 9/22   TA Iso w/ ADD 10 sec x 10  10 sec x 10 in 90/90 Start 9/22   TA Iso with ABD 3 x 10 Purple band;  ^10/11   TA Iso w/ heel slide (90/90) 30x  1.5#; ^10/11   Planks 3 x 30 sec  Start 10/18       Manual Intervention              Prone PA      GISTM/STM      Lumbar Manip      SI Manip      Hip belt mobs      Hip LA distraction      Rolling Posterior thigh and glute; ITB 8 min  Start 10/18     Therapeutic Exercise and NMR EXR  [x]  (97110) Provided verbal/tactile cueing for activities related to strengthening, flexibility, endurance, ROM  for improvements in proximal hip and core control with self care, mobility, lifting and ambulation.  [x]  [phone]) Provided verbal/tactile cueing for activities related to improving balance, coordination, kinesthetic sense, posture, motor skill, proprioception  to assist with core control in self care, mobility, lifting, and ambulation.     Therapeutic Activities:    [x]  [phone] or 75643) Provided verbal/tactile cueing for activities related to improving balance, coordination, kinesthetic sense, posture, motor skill, proprioception and motor activation to allow for proper function  with self care and ADLs  []  [phone]) Provided training and instruction to the patient for proper core and proximal hip recruitment and positioning with ambulation re-education     Home Exercise Program:    [x]  (88416) Reviewed/Progressed HEP activities related to strengthening, flexibility, endurance, ROM of core, proximal hip and LE for functional self-care, mobility, lifting and ambulation   []  (60630) Reviewed/Progressed HEP activities related to improving balance, coordination, kinesthetic sense, posture, motor skill, proprioception of core, proximal hip and LE for self care, mobility, lifting, and ambulation      Manual Treatments:  PROM / STM / Oscillations-Mobs:  G-I, II, III, IV (PA's, Inf., Post.)  []  (97140) Provided manual therapy to mobilize proximal hip  and LS spine soft tissue/joints for the purpose of modulating pain, promoting relaxation,  increasing ROM, reducing/eliminating soft tissue swelling/inflammation/restriction, improving soft tissue extensibility and allowing for proper ROM for normal function with  self care, mobility, lifting and ambulation.     Modalities:   Ice 10 min  Start 10/11      Charges:  Timed Code Treatment Minutes: 60   Total Treatment Minutes: 85  12:30- 1:55       []  EVAL (LOW) 97161 (typically 20 minutes face-to-face)  []  EVAL (MOD) 38756 (typically 30 minutes face-to-face)  []  EVAL (HIGH) 97163 (typically 45 minutes face-to-face)  []  RE-EVAL     [x]  EP(32951) x  1   []  IONTO  [x]  NMR (88416) x  1   []  VASO  [x]  Manual (97140) x  1    []  Other:  [x]  TA x  1    []  Mech Traction (60630)  []  ES(attended) (16010)      []  ES (un) (93235):     Goals:   Patient stated goal: Reduce pain; improve core strength and posture    Therapist goals for Patient:   Short Term Goals: To be achieved in: 2 weeks  1. Independent in HEP and progression per patient tolerance, in order to prevent re-injury. MET  2. Patient will have a decrease in pain to facilitate improvement in movement, function, and ADLs as indicated by Functional Deficits. MET    Long Term Goals: To be achieved in: 3-6 weeks  1. Disability index score of 10% or less for the Reunion to assist with reaching prior level of function in 6 weeks.   2. Patient will demonstrate increased AROM to WNL, good LS mobility, good hip ROM to allow for proper joint functioning as indicated by patients Functional Deficits in 4 weeks. MET  3. Patient will demonstrate an increase in Strength to good proximal hip and core activation to allow for proper functional mobility as indicated by patients Functional Deficits in 6 weeks. MET  4. Patient will return to independent functional activities without increased symptoms or restriction in 4 weeks.     Progression Towards Functional goals:  [x]  Patient is progressing as expected towards functional goals listed.    []  Progression is slowed due to complexities listed.  []  Progression has been slowed due to co-morbidities.  []  Plan just implemented, too soon to assess goals progression  []  Other:     ASSESSMENT:  See  eval    Treatment/Activity Tolerance:  [x]  Patient tolerated treatment well []  Patient limited by fatique  []  Patient limited by pain  []  Patient limited by other medical complications  [x]  Other:  11/3 Patient tolerated treatment well this session with fatigue present. Patient presented with signs and symptoms consistent with ITB tightness this session. Added crossbody stretch this session and rolled ITB with increased TTP noted. Patient tolerated well. Continue to progress as tolerated.      Patient education:  9/20 HEP provided and reviewed     Prognosis: [x]  Good []  Fair  []  Poor    Patient Requires Follow-up: [x]  Yes  []  No    PLAN:   [x]  Continue per plan of care []  Alter current plan (see comments)  []  Plan of care initiated []  Hold pending MD visit []  Discharge    Electronically signed by: Abby Potash PT, DPT

## 2016-09-27 ENCOUNTER — Inpatient Hospital Stay: Admit: 2016-09-27 | Primary: Family Medicine

## 2016-09-27 NOTE — Progress Notes (Signed)
[facility] ??? Orthopaedics and Sports Rehabilitation, [address], [place] [address]  Phone: [phone]   Fax:     [phone]    Physical Therapy Daily Treatment Note  Date:  09/27/2016    Patient Name:  Zachary Gregory  "Zachary Gregory"  DOB:  06-24-1981  MRN: [id number]  Restrictions/Precautions:    Medical/Treatment Diagnosis Information:  Diagnosis: S39.012 Lumbar Strain  Treatment Diagnosis: M45.5 Pain in Lumbar Spine  Insurance/Certification information:  PT Insurance Information: [facility]  Physician Information:  Referring Practitioner: Mart Piggs  Plan of care signed (Y/N):     Date of Patient follow up with Physician:     G-Code (if applicable):      Date G-Code Applied:  09/20/16 [place]: 7% deficit       Progress Note: [x]   Yes  []   No  Next due by: Visit #20  Or 10/20/16    Latex Allergy:  [x] NO      [] YES  Preferred Language for Healthcare:   [x] English       [] other:    Visit # Insurance Allowable   11  11/10 120     Pain level:  1/10  11/10     SUBJECTIVE:  11/10 Patient states that he was sore for awhile after last session but states it feels pretty good. He states that he has a lot of pain in his low back on Wednesday but thinks he was on his feet for a long time and was fatigued.       OBJECTIVE:   ROM 11/3  Comments   Trunk flexion WNL Pain present   Trunk extension WNL    Trunk R sidebend WNL    Trunk L sidebend WNL    Trunk R rotation WNL    Trunk L rotation WNL    HS flexibility                 Strength  11/3 Left Right Comments   Hip flexion(L2) 5 5    Knee extension(L3) 5 5    Knee flexion(S1-2) 5 5 Pain in back with MMT   Ankle dorsiflexion(L4) 5 5    Toe extension(L5) 5 5    Ankle eversion/plantar flexion(S1) 5 5      Special tests 9/20  Comments   SLR negative    Slump test     Pelvic symmetry  R higher than left    Segmental Spinal mobility     Heel walk negative    Toe walk negative    Tandem walk            DTR???s  11/3 Left Right Comments    Patellar(L3-L4) = =    Achilles(S1-S2) = =            Joint mobility: 11/3   [x] Normal    [] Hypo   [] Hyper    Palpation: Tenderness bilateral PSIS  9/20    Functional Mobility/Transfers: Independent with increased pain   9/20    Posture: Forward head and rounded shoulders; decreased lumbar lordosis in sitting  9/20    Gait: (include devices/WB status) WNL  9/20                         [x]  Patient history, allergies, meds reviewed. Medical chart reviewed. See intake form.  9/20    Review Of Systems (ROS):  [x] Performed Review of systems (Integumentary, CardioPulmonary,  Neurological) by intake and observation. Intake form has been scanned into medical record. Patient has been instructed to contact their primary care physician regarding ROS issues if not already being addressed at this time.  9/20    RESTRICTIONS/PRECAUTIONS: None    Exercises/Interventions: BILATERAL  ROM/stretches     SKTC 30 sec x 5 Start 9/20   DKTC     Prayer stretch     Supine HS 30 sec x 5 Start 9/20   Pelvic tilt     Hook lying rotation 10 sec x 10 each side  Start 9/22   Crossbody stretch (ITB) 30 sec x 5 Start 11/3   Piriformis 30 sec x 5 Start 9/20   Strengthening     SLR 3 x 10; 3# ^11/10   SLR ABD 3 x 10; 3# ^11/10   Start 9/29   Start 9/29; cane as feedback   Quadruped alternate UE/LE reaches 3 x 10 Start 10/2   Start 10/6   Black; start 10/11   TA Iso in 90/90 with UE flexion 3 x 10 7# ball; w/ ball squeeze;  ^10/11   clamshells 3 x 10; purple ^10/11   Start 10/2   Start 10/2   Start 9/22   TA Iso w/ ADD 10 sec x 10  10 sec x 10 in 90/90 Start 9/22   TA Iso with ABD 3 x 10 Purple band;  ^10/11   TA Iso w/ heel slide (90/90) 30x  1.5#; ^10/11   Planks 3 x 30 sec  Start 10/18   TA ISO with UE rotation 30x 7#; Start 11/10   TA ISO with LTR in 90/90 30x Start 11/10   Plank with shoulder flexion on ball 30x Start 11/10       Manual Intervention             Prone PA      GISTM/STM      Lumbar Manip      SI Manip      Hip belt mobs      Hip  LA distraction      Rolling Posterior thigh and glute; ITB 8 min  Start 10/18     Therapeutic Exercise and NMR EXR  [x]  (97110) Provided verbal/tactile cueing for activities related to strengthening, flexibility, endurance, ROM  for improvements in proximal hip and core control with self care, mobility, lifting and ambulation.  [x]  [phone]) Provided verbal/tactile cueing for activities related to improving balance, coordination, kinesthetic sense, posture, motor skill, proprioception  to assist with core control in self care, mobility, lifting, and ambulation.     Therapeutic Activities:    [x]  [phone] or 27035) Provided verbal/tactile cueing for activities related to improving balance, coordination, kinesthetic sense, posture, motor skill, proprioception and motor activation to allow for proper function  with self care and ADLs  []  [phone]) Provided training and instruction to the patient for proper core and proximal hip recruitment and positioning with ambulation re-education     Home Exercise Program:    [x]  (18299) Reviewed/Progressed HEP activities related to strengthening, flexibility, endurance, ROM of core, proximal hip and LE for functional self-care, mobility, lifting and ambulation   []  (37169) Reviewed/Progressed HEP activities related to improving balance, coordination, kinesthetic sense, posture, motor skill, proprioception of core, proximal hip and LE for self care, mobility, lifting, and ambulation      Manual Treatments:  PROM / STM / Oscillations-Mobs:  G-I, II, III, IV (PA's, Inf., Post.)  []  (97140) Provided  manual therapy to mobilize proximal hip and LS spine soft tissue/joints for the purpose of modulating pain, promoting relaxation,  increasing ROM, reducing/eliminating soft tissue swelling/inflammation/restriction, improving soft tissue extensibility and allowing for proper ROM for normal function with self care, mobility, lifting and ambulation.     Modalities:   Ice 10 min  Start  10/11      Charges:  Timed Code Treatment Minutes: 60   Total Treatment Minutes: 81  12:00- 1:21        []  EVAL (LOW) 97161 (typically 20 minutes face-to-face)  []  EVAL (MOD) 35009 (typically 30 minutes face-to-face)  []  EVAL (HIGH) 97163 (typically 45 minutes face-to-face)  []  RE-EVAL     [x]  FG(18299) x  1   []  IONTO  [x]  NMR (37169) x  1   []  VASO   [x]  Manual (97140) x  1    []  Other:  [x]  TA x  1    []  Mech Traction (67893)  []  ES(attended) (81017)      []  ES (un) (51025):     Goals:   Patient stated goal: Reduce pain; improve core strength and posture    Therapist goals for Patient:   Short Term Goals: To be achieved in: 2 weeks  1. Independent in HEP and progression per patient tolerance, in order to prevent re-injury. MET  2. Patient will have a decrease in pain to facilitate improvement in movement, function, and ADLs as indicated by Functional Deficits. MET    Long Term Goals: To be achieved in: 3-6 weeks  1. Disability index score of 10% or less for the Reunion to assist with reaching prior level of function in 6 weeks.   2. Patient will demonstrate increased AROM to WNL, good LS mobility, good hip ROM to allow for proper joint functioning as indicated by patients Functional Deficits in 4 weeks. MET  3. Patient will demonstrate an increase in Strength to good proximal hip and core activation to allow for proper functional mobility as indicated by patients Functional Deficits in 6 weeks. MET  4. Patient will return to independent functional activities without increased symptoms or restriction in 4 weeks.     Progression Towards Functional goals:  [x]  Patient is progressing as expected towards functional goals listed.    []  Progression is slowed due to complexities listed.  []  Progression has been slowed due to co-morbidities.  []  Plan just implemented, too soon to assess goals progression  []  Other:     ASSESSMENT:  See eval    Treatment/Activity Tolerance:  [x]  Patient tolerated treatment well []  Patient  limited by fatique  []  Patient limited by pain  []  Patient limited by other medical complications  [x]  Other:  11/10 Patient tolerated treatment well this session with no reports of pain. Patient stated that he "felt like the exercises were working where they needed to work". Good tolerance to rolling this session with patient reporting relief in symptoms. Patient tolerated addition of more advanced core exercises well with fatigue evident. Continue to progress as tolerated.      Patient education:  9/20 HEP provided and reviewed     Prognosis: [x]  Good []  Fair  []  Poor    Patient Requires Follow-up: [x]  Yes  []  No    PLAN:   [x]  Continue per plan of care []  Alter current plan (see comments)  []  Plan of care initiated []  Hold pending MD visit []  Discharge    Electronically signed by: Abby Potash PT, DPT

## 2016-10-04 ENCOUNTER — Inpatient Hospital Stay: Admit: 2016-10-04 | Primary: Family Medicine

## 2016-10-04 NOTE — Other (Signed)
[facility] ??? Orthopaedics and [facility] Rehabilitation, [address], [place] [address]  Phone: [phone]   Fax:     [phone]    Physical Therapy Daily Treatment Note  Date:  10/04/2016    Patient Name:  Zachary Gregory  [name]  DOB:  08/04/1981  MRN: [id number]  Restrictions/Precautions:    Medical/Treatment Diagnosis Information:  Diagnosis: S39.012 Lumbar Strain  Treatment Diagnosis: M45.5 Pain in Lumbar Spine  Insurance/Certification information:  PT Insurance Information: [facility]  Physician Information:  Referring Practitioner: Mart Piggs  Plan of care signed (Y/N):     Date of Patient follow up with Physician:     G-Code (if applicable):      Date G-Code Applied:  09/20/16 [place]: 7% deficit       Progress Note: [x]   Yes  []   No  Next due by: Visit #20  Or 10/20/16    Latex Allergy:  [x] NO      [] YES  Preferred Language for Healthcare:   [x] English       [] other:    Visit # Insurance Allowable   12  11/17 120     Pain level:  0/10  11/17     SUBJECTIVE:  11/17 Patient states he was sore after last session but is feeling pretty good today. He states that he had a dull ache in his back all day today but states it was manageable.       OBJECTIVE:   ROM 11/3  Comments   Trunk flexion WNL Pain present   Trunk extension WNL    Trunk R sidebend WNL    Trunk L sidebend WNL    Trunk R rotation WNL    Trunk L rotation WNL    HS flexibility                 Strength  11/3 Left Right Comments   Hip flexion(L2) 5 5    Knee extension(L3) 5 5    Knee flexion(S1-2) 5 5 Pain in back with MMT   Ankle dorsiflexion(L4) 5 5    Toe extension(L5) 5 5    Ankle eversion/plantar flexion(S1) 5 5      Special tests 9/20  Comments   SLR negative    Slump test     Pelvic symmetry  R higher than left    Segmental Spinal mobility     Heel walk negative    Toe walk negative    Tandem walk            DTR???s  11/3 Left Right Comments   Patellar(L3-L4) = =    Achilles(S1-S2) = =            Joint  mobility: 11/3   [x] Normal    [] Hypo   [] Hyper    Palpation: Tenderness bilateral PSIS  9/20    Functional Mobility/Transfers: Independent with increased pain   9/20    Posture: Forward head and rounded shoulders; decreased lumbar lordosis in sitting  9/20    Gait: (include devices/WB status) WNL  9/20                         [x]  Patient history, allergies, meds reviewed. Medical chart reviewed. See intake form.  9/20    Review Of Systems (ROS):  [x] Performed Review of systems (Integumentary, CardioPulmonary, Neurological) by intake and observation. Intake form has been scanned into medical record.  Patient has been instructed to contact their primary care physician regarding ROS issues if not already being addressed at this time.  9/20    RESTRICTIONS/PRECAUTIONS: None    Exercises/Interventions: BILATERAL  ROM/stretches     SKTC 30 sec x 5 Start 9/20   DKTC     Prayer stretch     Supine HS 30 sec x 5 Start 9/20   Pelvic tilt     Hook lying rotation 10 sec x 10 each side  Start 9/22   Crossbody stretch (ITB) 30 sec x 5 Start 11/3   Piriformis 30 sec x 5 Start 9/20   Strengthening     SLR 3 x 10; 3# ^11/10   SLR ABD 3 x 10; 3# ^11/10   Start 9/29   Start 9/29; cane as feedback   Quadruped alternate UE/LE reaches 3 x 10 Start 10/2   Start 10/6   Black; start 10/11   TA Iso in 90/90 with UE flexion 3 x 10 11# ball; w/ ball squeeze;  ^10/17   clamshells 3 x 10; purple ^10/11   Start 10/2   Start 10/2   Start 9/22   TA Iso w/ ADD 10 sec x 10  10 sec x 10 in 90/90 Start 9/22   TA Iso with ABD 3 x 10 Purple band;  ^10/11   TA Iso w/ heel slide (90/90) 30x  1.5#; ^10/11   Bridges 3 x 10 (5 second hold) SB; ^10/25   Planks 3 x 30 sec  Start 10/18   TA ISO with UE rotation 30x 7#; Start 11/10   TA ISO with LTR in 90/90 30x Start 11/10   Triple Threats 3 x 10 Start 11/17   Plank with shoulder flexion on ball 30x Start 11/10       Manual Intervention             Prone PA      GISTM/STM      Lumbar Manip      SI Manip      Hip  belt mobs      Hip LA distraction      Rolling Posterior thigh and glute; ITB 8 min  Start 10/18     Therapeutic Exercise and NMR EXR  [x]  (97110) Provided verbal/tactile cueing for activities related to strengthening, flexibility, endurance, ROM  for improvements in proximal hip and core control with self care, mobility, lifting and ambulation.  [x]  [id number]) Provided verbal/tactile cueing for activities related to improving balance, coordination, kinesthetic sense, posture, motor skill, proprioception  to assist with core control in self care, mobility, lifting, and ambulation.     Therapeutic Activities:    [x]  [phone] or 64403) Provided verbal/tactile cueing for activities related to improving balance, coordination, kinesthetic sense, posture, motor skill, proprioception and motor activation to allow for proper function  with self care and ADLs  []  [phone]) Provided training and instruction to the patient for proper core and proximal hip recruitment and positioning with ambulation re-education     Home Exercise Program:    [x]  (95638) Reviewed/Progressed HEP activities related to strengthening, flexibility, endurance, ROM of core, proximal hip and LE for functional self-care, mobility, lifting and ambulation   []  (75643) Reviewed/Progressed HEP activities related to improving balance, coordination, kinesthetic sense, posture, motor skill, proprioception of core, proximal hip and LE for self care, mobility, lifting, and ambulation      Manual Treatments:  PROM / STM / Oscillations-Mobs:  G-I, II, III, IV (  PA's, Inf., Post.)  []  (76811) Provided manual therapy to mobilize proximal hip and LS spine soft tissue/joints for the purpose of modulating pain, promoting relaxation,  increasing ROM, reducing/eliminating soft tissue swelling/inflammation/restriction, improving soft tissue extensibility and allowing for proper ROM for normal function with self care, mobility, lifting and ambulation.     Modalities:   Ice 10 min   Start 10/11      Charges:  Timed Code Treatment Minutes: 60   Total Treatment Minutes: 93  12:00- 1: 33       []  EVAL (LOW) 97161 (typically 20 minutes face-to-face)  []  EVAL (MOD) 57262 (typically 30 minutes face-to-face)  []  EVAL (HIGH) 97163 (typically 45 minutes face-to-face)  []  RE-EVAL     [x]  MB(55974) x  1   []  IONTO  [x]  NMR (16384) x  1   []  VASO   [x]  Manual (97140) x  1    []  Other:  [x]  TA x  1    []  Mech Traction (53646)  []  ES(attended) (80321)      []  ES (un) (22482):     Goals:   Patient stated goal: Reduce pain; improve core strength and posture    Therapist goals for Patient:   Short Term Goals: To be achieved in: 2 weeks  1. Independent in HEP and progression per patient tolerance, in order to prevent re-injury. MET  2. Patient will have a decrease in pain to facilitate improvement in movement, function, and ADLs as indicated by Functional Deficits. MET    Long Term Goals: To be achieved in: 3-6 weeks  1. Disability index score of 10% or less for the Reunion to assist with reaching prior level of function in 6 weeks.   2. Patient will demonstrate increased AROM to WNL, good LS mobility, good hip ROM to allow for proper joint functioning as indicated by patients Functional Deficits in 4 weeks. MET  3. Patient will demonstrate an increase in Strength to good proximal hip and core activation to allow for proper functional mobility as indicated by patients Functional Deficits in 6 weeks. MET  4. Patient will return to independent functional activities without increased symptoms or restriction in 4 weeks.     Progression Towards Functional goals:  [x]  Patient is progressing as expected towards functional goals listed.    []  Progression is slowed due to complexities listed.  []  Progression has been slowed due to co-morbidities.  []  Plan just implemented, too soon to assess goals progression  []  Other:     ASSESSMENT:  See eval    Treatment/Activity Tolerance:  [x]  Patient tolerated treatment well []   Patient limited by fatique  []  Patient limited by pain  []  Patient limited by other medical complications  [x]  Other:  11/17 Patient tolerated treatment well this session with reports of fatigue at end of session. Patient continue to be able to maintain proper core control with exercises and increased in resistance bands and weights. Continue to progress as tolerated.     Patient education:  9/20 HEP provided and reviewed     Prognosis: [x]  Good []  Fair  []  Poor    Patient Requires Follow-up: [x]  Yes  []  No    PLAN:   [x]  Continue per plan of care []  Alter current plan (see comments)  []  Plan of care initiated []  Hold pending MD visit []  Discharge    Electronically signed by: Abby Potash PT, DPT

## 2016-10-09 ENCOUNTER — Inpatient Hospital Stay: Primary: Family Medicine

## 2016-10-09 NOTE — Other (Signed)
Physical Therapy  Cancellation/No-show Note  Patient Name:  Zachary GibbsJoseph Gregory  DOB:  02/02/1981   Date:  10/09/2016  Cancelled visits to date: 1   No-shows to date: 0    For today's appointment patient:  [x]   Cancelled  []   Rescheduled appointment  []   No-show     Reason given by patient:  [x]   Patient ill  []   Conflicting appointment  []   No transportation    []   Conflict with work  []   No reason given  []   Other:     Comments:      Electronically signed by:  Dillon BjorkSarah Ulrich Soules, PT

## 2016-10-16 ENCOUNTER — Inpatient Hospital Stay: Primary: Family Medicine

## 2016-10-16 NOTE — Other (Signed)
Physical Therapy  Cancellation/No-show Note  Patient Name:  Zachary Gregory  DOB:  09/10/1981   Date:  10/16/2016  Cancelled visits to date: 1   No-shows to date: 1    For today's appointment patient:  []   Cancelled  []   Rescheduled appointment  [x]   No-show     Reason given by patient:  []   Patient ill  []   Conflicting appointment  []   No transportation    []   Conflict with work  [x]   No reason given  []   Other:     Comments:       Electronically signed by:  Dillon BjorkSarah Loucille Takach, Pt, DPT

## 2016-10-21 ENCOUNTER — Inpatient Hospital Stay: Admit: 2016-10-21 | Primary: Family Medicine

## 2016-10-21 NOTE — Other (Signed)
[facility] ??? Orthopaedics and [place], [address], [place] [address]  Phone: [phone]   Fax:     [phone]    Physical Therapy Daily Treatment Note  Date:  10/21/2016    Patient Name:  Zachary Gregory  "Joe"  DOB:  Dec 27, 1980  MRN: [id number]  Restrictions/Precautions:    Medical/Treatment Diagnosis Information:  Diagnosis: S39.012 Lumbar Strain  Treatment Diagnosis: M45.5 Pain in Lumbar Spine  Insurance/Certification information:  PT Insurance Information: [facility]  Physician Information:  Referring Practitioner: Mart Piggs  Plan of care signed (Y/N):     Date of Patient follow up with Physician:     G-Code (if applicable):      Date G-Code Applied:  09/20/16 [place]: 7% deficit       Progress Note: []   Yes  [x]   No  Next due by: Visit #20  Or 10/20/16    Latex Allergy:  [x] NO      [] YES  Preferred Language for Healthcare:   [x] English       [] other:    Visit # Insurance Allowable   13  12/4 120     Pain level:  2-3/10   12/4     SUBJECTIVE:  12/4       OBJECTIVE:   ROM 11/3  Comments   Trunk flexion WNL Pain present   Trunk extension WNL    Trunk R sidebend WNL    Trunk L sidebend WNL    Trunk R rotation WNL    Trunk L rotation WNL    HS flexibility                 Strength  11/3 Left Right Comments   Hip flexion(L2) 5 5    Knee extension(L3) 5 5    Knee flexion(S1-2) 5 5 Pain in back with MMT   Ankle dorsiflexion(L4) 5 5    Toe extension(L5) 5 5    Ankle eversion/plantar flexion(S1) 5 5      Special tests 9/20  Comments   SLR negative    Slump test     Pelvic symmetry  R higher than left    Segmental Spinal mobility     Heel walk negative    Toe walk negative    Tandem walk            DTR???s  11/3 Left Right Comments   Patellar(L3-L4) = =    Achilles(S1-S2) = =            Joint mobility: 11/3   [x] Normal    [] Hypo   [] Hyper    Palpation: Tenderness bilateral PSIS  9/20    Functional Mobility/Transfers: Independent with increased pain    9/20    Posture: Forward head and rounded shoulders; decreased lumbar lordosis in sitting  9/20    Gait: (include devices/WB status) WNL  9/20                         [x]  Patient history, allergies, meds reviewed. Medical chart reviewed. See intake form.  9/20    Review Of Systems (ROS):  [x] Performed Review of systems (Integumentary, CardioPulmonary, Neurological) by intake and observation. Intake form has been scanned into medical record. Patient has been instructed to contact their primary care physician regarding ROS issues if not already being addressed at this time.  9/20    RESTRICTIONS/PRECAUTIONS: None    Exercises/Interventions:  BILATERAL  ROM/stretches     SKTC 30 sec x 5 Start 9/20   DKTC     Prayer stretch     Supine HS 30 sec x 5 Start 9/20   Pelvic tilt     Hook lying rotation 10 sec x 10 each side  Start 9/22   Crossbody stretch (ITB) 30 sec x 5 Start 11/3   Piriformis 30 sec x 5 Start 9/20   Strengthening     SLR 3 x 10; 4# ^12/4   SLR ABD 3 x 10; 4# ^12/4   Start 9/29   Start 9/29; cane as feedback   Quadruped alternate UE/LE reaches 3 x 10 Start 10/2   Start 10/6   Black; start 10/11   TA Iso in 90/90 with UE flexion 3 x 10 11# ball; w/ ball squeeze;  ^10/17   clamshells 3 x 10; purple ^10/11   Start 10/2   Start 10/2   Start 9/22   TA Iso w/ ADD 10 sec x 10  (10 sec x 10 in 90/90) 2x ^12/4   TA Iso with ABD 3 x 10 Purple band;  ^10/11   TA Iso w/ heel slide (90/90) 30x  4#; ^12/4   Bridges 3 x 10 (5 second hold) SB; ^10/25   Planks 3 x 30 sec  Start 10/18   TA ISO with UE rotation 30x 7#; Start 11/10   TA ISO with LTR in 90/90 30x Start 11/10   Triple Threats 3 x 10 Start 11/17   Plank with shoulder flexion on ball 30x Start 11/10       Manual Intervention             Prone PA      GISTM/STM      Lumbar Manip      SI Manip      Hip belt mobs      Hip LA distraction      Rolling Posterior thigh and glute; ITB 8 min  Start 10/18     Therapeutic Exercise and NMR EXR  [x]  (97110) Provided  verbal/tactile cueing for activities related to strengthening, flexibility, endurance, ROM  for improvements in proximal hip and core control with self care, mobility, lifting and ambulation.  [x]  [phone]) Provided verbal/tactile cueing for activities related to improving balance, coordination, kinesthetic sense, posture, motor skill, proprioception  to assist with core control in self care, mobility, lifting, and ambulation.     Therapeutic Activities:    [x]  [phone] or 06004) Provided verbal/tactile cueing for activities related to improving balance, coordination, kinesthetic sense, posture, motor skill, proprioception and motor activation to allow for proper function  with self care and ADLs  []  [phone]) Provided training and instruction to the patient for proper core and proximal hip recruitment and positioning with ambulation re-education     Home Exercise Program:    [x]  (41423) Reviewed/Progressed HEP activities related to strengthening, flexibility, endurance, ROM of core, proximal hip and LE for functional self-care, mobility, lifting and ambulation   []  (95320) Reviewed/Progressed HEP activities related to improving balance, coordination, kinesthetic sense, posture, motor skill, proprioception of core, proximal hip and LE for self care, mobility, lifting, and ambulation      Manual Treatments:  PROM / STM / Oscillations-Mobs:  G-I, II, III, IV (PA's, Inf., Post.)  []  (97140) Provided manual therapy to mobilize proximal hip and LS spine soft tissue/joints for the purpose of modulating pain, promoting relaxation,  increasing ROM, reducing/eliminating soft tissue  swelling/inflammation/restriction, improving soft tissue extensibility and allowing for proper ROM for normal function with self care, mobility, lifting and ambulation.     Modalities:   Ice 10 min  Start 10/11      Charges:  Timed Code Treatment Minutes: 60   Total Treatment Minutes: 80  4:47- 6: 06       []  EVAL (LOW) 97161 (typically 20 minutes  face-to-face)  []  EVAL (MOD) 76283 (typically 30 minutes face-to-face)  []  EVAL (HIGH) 97163 (typically 45 minutes face-to-face)  []  RE-EVAL     [x]  TD(17616) x  1   []  IONTO  [x]  NMR (07371) x  1   []  VASO   [x]  Manual (97140) x  1    []  Other:  [x]  TA x  1    []  Mech Traction (06269)  []  ES(attended) (48546)      []  ES (un) (27035):     Goals:   Patient stated goal: Reduce pain; improve core strength and posture    Therapist goals for Patient:   Short Term Goals: To be achieved in: 2 weeks  1. Independent in HEP and progression per patient tolerance, in order to prevent re-injury. MET  2. Patient will have a decrease in pain to facilitate improvement in movement, function, and ADLs as indicated by Functional Deficits. MET    Long Term Goals: To be achieved in: 3-6 weeks  1. Disability index score of 10% or less for the Reunion to assist with reaching prior level of function in 6 weeks.   2. Patient will demonstrate increased AROM to WNL, good LS mobility, good hip ROM to allow for proper joint functioning as indicated by patients Functional Deficits in 4 weeks. MET  3. Patient will demonstrate an increase in Strength to good proximal hip and core activation to allow for proper functional mobility as indicated by patients Functional Deficits in 6 weeks. MET  4. Patient will return to independent functional activities without increased symptoms or restriction in 4 weeks.     Progression Towards Functional goals:  [x]  Patient is progressing as expected towards functional goals listed.    []  Progression is slowed due to complexities listed.  []  Progression has been slowed due to co-morbidities.  []  Plan just implemented, too soon to assess goals progression  []  Other:     ASSESSMENT:  See eval    Treatment/Activity Tolerance:  [x]  Patient tolerated treatment well []  Patient limited by fatique  []  Patient limited by pain  []  Patient limited by other medical complications  [x]  Other:  12/4 Patient tolerated treatment  well this session with reports of fatigue at end of session. Patient continue to be able to maintain proper core control with exercises and increased in resistance bands and weights. Patient progressing well; progress note NPV. Continue to progress as tolerated.     Patient education:  9/20 HEP provided and reviewed     Prognosis: [x]  Good []  Fair  []  Poor    Patient Requires Follow-up: [x]  Yes  []  No    PLAN:   [x]  Continue per plan of care []  Alter current plan (see comments)  []  Plan of care initiated []  Hold pending MD visit []  Discharge    Electronically signed by: Abby Potash PT, DPT

## 2016-10-28 ENCOUNTER — Inpatient Hospital Stay: Admit: 2016-10-28 | Primary: Family Medicine

## 2016-10-28 NOTE — Other (Signed)
[facility] ??? Orthopaedics and Sports Rehabilitation, [address], [place] [address]  Phone: [phone]   Fax:     [phone]    Physical Therapy Daily Treatment Note  Date:  10/28/2016    Patient Name:  Zachary Gregory  "Zachary Gregory"  DOB:  19-Aug-1981  MRN: [id number]  Restrictions/Precautions:    Medical/Treatment Diagnosis Information:  Diagnosis: S39.012 Lumbar Strain  Treatment Diagnosis: M45.5 Pain in Lumbar Spine  Insurance/Certification information:  PT Insurance Information: [facility]  Physician Information:  Referring Practitioner: Mart Piggs  Plan of care signed (Y/N):     Date of Patient follow up with Physician:     G-Code (if applicable):      Date G-Code Applied:  09/20/16 [place]: 7% deficit       Progress Note: []   Yes  [x]   No  Next due by: Visit #20  Or 10/20/16    Latex Allergy:  [x] NO      [] YES  Preferred Language for Healthcare:   [x] English       [] other:    Visit # Insurance Allowable   14  12/11 120     Pain level:  2/10   12/11     SUBJECTIVE:  12/11 Patient states he has had some pain since his last visit. He states he has changed shoes since last visit. He states that he has        OBJECTIVE:    ROM 11/3  Comments   Trunk flexion WNL Pain present   Trunk extension WNL    Trunk R sidebend WNL    Trunk L sidebend WNL    Trunk R rotation WNL    Trunk L rotation WNL    HS flexibility                 Strength  11/3 Left Right Comments   Hip flexion(L2) 5 5    Knee extension(L3) 5 5    Knee flexion(S1-2) 5 5 Pain in back with MMT   Ankle dorsiflexion(L4) 5 5    Toe extension(L5) 5 5    Ankle eversion/plantar flexion(S1) 5 5      Special tests 9/20  Comments   SLR negative    Slump test     Pelvic symmetry  R higher than left    Segmental Spinal mobility     Heel walk negative    Toe walk negative    Tandem walk            DTR???s  11/3 Left Right Comments   Patellar(L3-L4) = =    Achilles(S1-S2) = =            Joint mobility:  11/3   [x] Normal    [] Hypo   [] Hyper    Palpation: Tenderness bilateral PSIS  9/20    Functional Mobility/Transfers: Independent with increased pain   9/20    Posture: Forward head and rounded shoulders; decreased lumbar lordosis in sitting  9/20    Gait: (include devices/WB status) WNL  9/20                         [x]  Patient history, allergies, meds reviewed. Medical chart reviewed. See intake form.  9/20    Review Of Systems (ROS):  [x] Performed Review of systems (Integumentary, CardioPulmonary, Neurological) by intake and observation. Intake form has been scanned into medical record. Patient has been instructed to  contact their primary care physician regarding ROS issues if not already being addressed at this time.  9/20    RESTRICTIONS/PRECAUTIONS: None    Exercises/Interventions: BILATERAL  ROM/stretches     SKTC 30 sec x 5 Start 9/20   DKTC     Prayer stretch     Supine HS 30 sec x 5 Start 9/20   Pelvic tilt     Hook lying rotation 10 sec x 10 each side  Start 9/22   Crossbody stretch (ITB) 30 sec x 5 Start 11/3   Piriformis 30 sec x 5 Start 9/20   Strengthening     SLR 3 x 10; 4# ^12/4   SLR ABD 3 x 10; 4# ^12/4   Start 9/29   Start 9/29; cane as feedback   Quadruped alternate UE/LE reaches 3 x 10 Start 10/2   Start 10/6   Black; start 10/11   TA Iso in 90/90 with UE flexion 3 x 10 11# ball; w/ ball squeeze;  ^10/17   clamshells 3 x 10; purple ^10/11   Start 10/2   Start 10/2   Start 9/22   TA Iso w/ ADD 10 sec x 10  (10 sec x 10 in 90/90) 2x ^12/4   TA Iso with ABD 3 x 10 Purple band;  ^10/11   TA Iso w/ heel slide (90/90) 30x  4#; ^12/4   Bridges 3 x 10 (5 second hold) SB; ^10/25   Planks 3 x 30 sec  Start 10/18   TA ISO with UE rotation 30x 7#; Start 11/10   TA ISO with LTR in 90/90 30x Start 11/10   Triple Threats 3 x 10 Start 11/17   Plank with shoulder flexion on ball 30x Start 11/10       Manual Intervention             Prone PA      GISTM/STM      Lumbar Manip      SI Manip      Hip belt mobs       Hip LA distraction      Rolling Posterior thigh and glute; ITB 8 min  Start 10/18     Therapeutic Exercise and NMR EXR  [x]  (97110) Provided verbal/tactile cueing for activities related to strengthening, flexibility, endurance, ROM  for improvements in proximal hip and core control with self care, mobility, lifting and ambulation.  [x]  [phone]) Provided verbal/tactile cueing for activities related to improving balance, coordination, kinesthetic sense, posture, motor skill, proprioception  to assist with core control in self care, mobility, lifting, and ambulation.     Therapeutic Activities:    [x]  [phone] or 09811) Provided verbal/tactile cueing for activities related to improving balance, coordination, kinesthetic sense, posture, motor skill, proprioception and motor activation to allow for proper function  with self care and ADLs  []  [phone]) Provided training and instruction to the patient for proper core and proximal hip recruitment and positioning with ambulation re-education     Home Exercise Program:    [x]  (29562) Reviewed/Progressed HEP activities related to strengthening, flexibility, endurance, ROM of core, proximal hip and LE for functional self-care, mobility, lifting and ambulation   []  (97112) Reviewed/Progressed HEP activities related to improving balance, coordination, kinesthetic sense, posture, motor skill, proprioception of core, proximal hip and LE for self care, mobility, lifting, and ambulation      Manual Treatments:  PROM / STM / Oscillations-Mobs:  G-I, II, III, IV (PA's, Inf., Post.)  []  (  22633) Provided manual therapy to mobilize proximal hip and LS spine soft tissue/joints for the purpose of modulating pain, promoting relaxation,  increasing ROM, reducing/eliminating soft tissue swelling/inflammation/restriction, improving soft tissue extensibility and allowing for proper ROM for normal function with self care, mobility, lifting and ambulation.     Modalities:   Ice 10 min  Start  10/11      Charges:  Timed Code Treatment Minutes: 55   Total Treatment Minutes: 70  5:00- 6: 10       []  EVAL (LOW) 97161 (typically 20 minutes face-to-face)  []  EVAL (MOD) 35456 (typically 30 minutes face-to-face)  []  EVAL (HIGH) 97163 (typically 45 minutes face-to-face)  []  RE-EVAL     [x]  YB(63893) x  1   []  IONTO  [x]  NMR (73428) x  1   []  VASO   [x]  Manual (97140) x  1    []  Other:  [x]  TA x  1    []  Mech Traction (76811)  []  ES(attended) (57262)      []  ES (un) (03559):     Goals:   Patient stated goal: Reduce pain; improve core strength and posture    Therapist goals for Patient:   Short Term Goals: To be achieved in: 2 weeks  1. Independent in HEP and progression per patient tolerance, in order to prevent re-injury. MET  2. Patient will have a decrease in pain to facilitate improvement in movement, function, and ADLs as indicated by Functional Deficits. MET    Long Term Goals: To be achieved in: 3-6 weeks  1. Disability index score of 10% or less for the [place] to assist with reaching prior level of function in 6 weeks.   2. Patient will demonstrate increased AROM to WNL, good LS mobility, good hip ROM to allow for proper joint functioning as indicated by patients Functional Deficits in 4 weeks. MET  3. Patient will demonstrate an increase in Strength to good proximal hip and core activation to allow for proper functional mobility as indicated by patients Functional Deficits in 6 weeks. MET  4. Patient will return to independent functional activities without increased symptoms or restriction in 4 weeks.     Progression Towards Functional goals:  [x]  Patient is progressing as expected towards functional goals listed.    []  Progression is slowed due to complexities listed.  []  Progression has been slowed due to co-morbidities.  []  Plan just implemented, too soon to assess goals progression  []  Other:     ASSESSMENT:  See eval    Treatment/Activity Tolerance:  [x]  Patient tolerated treatment well []  Patient  limited by fatique  []  Patient limited by pain  []  Patient limited by other medical complications  [x]  Other:  12/11 Patient tolerated treatment well this session with reports of fatigue at end of session. Patient continue to be able to maintain proper core control with exercises and increased in resistance bands and weights. Discussed GAP program with patient once discharged from therapy. Continue to progress as tolerated.     Patient education:  9/20 HEP provided and reviewed     Prognosis: [x]  Good []  Fair  []  Poor    Patient Requires Follow-up: [x]  Yes  []  No    PLAN:   [x]  Continue per plan of care []  Alter current plan (see comments)  []  Plan of care initiated []  Hold pending MD visit []  Discharge    Electronically signed by: Abby Potash PT, DPT

## 2016-12-17 ENCOUNTER — Inpatient Hospital Stay: Admit: 2016-12-17 | Primary: Family Medicine

## 2016-12-17 NOTE — Plan of Care (Cosign Needed)
[facility] - Orthopaedics and Sports Rehabilitation, [facility]  [address]    [place], [place] [address]  Phone: [phone]   Fax:     [phone]    Physical Therapy Daily Treatment Note  Date:  12/17/2016    Patient Name:  Zachary Gregory  "Zachary Gregory"  DOB:  Nov 26, 1980  MRN: [id number]  Restrictions/Precautions:    Medical/Treatment Diagnosis Information:  Diagnosis: S39.012 Lumbar Strain  Treatment Diagnosis: M45.5 Pain in Lumbar Spine  Insurance/Certification information:  PT Insurance Information: [facility]  Physician Information:  Referring Practitioner: Alyce Pagan  Plan of care signed (Y/N): Y    Date of Patient follow up with Physician:     G-Code (if applicable):      Date G-Code Applied:  12/17/16 [place]: 8% deficit  PT G-Codes  Functional Assessment Tool Used: [place]  Score: 8% deficit  Functional Limitation: Changing and maintaining body position  Changing and Maintaining Body Position Current Status [phone]): At least 1 percent but less than 20 percent impaired, limited or restricted  Changing and Maintaining Body Position Goal Status (N0272): 0 percent impaired, limited or restricted    Progress Note: []   Yes  [x]   No  Next due by: 2/30/18    Latex Allergy:  [x] NO      [] YES  Preferred Language for Healthcare:   [x] English       [] other:    Visit # Insurance Allowable   1  1/30  14  Used 2017 120     Pain level:  0/10   1/30     SUBJECTIVE:  1/30 Patient states that he has been doing well. He states he has had no issues and is working out and doing yoga at home.       OBJECTIVE:    ROM 1/30  Comments   Trunk flexion WNL    Trunk extension WNL    Trunk R sidebend WNL    Trunk L sidebend WNL    Trunk R rotation WNL    Trunk L rotation WNL    HS flexibility                 Strength  11/3 Left Right Comments   Hip flexion(L2) 5 5    Knee extension(L3) 5 5    Knee flexion(S1-2) 5 5 Pain in back with MMT   Ankle dorsiflexion(L4) 5 5    Toe extension(L5) 5 5    Ankle eversion/plantar  flexion(S1) 5 5      Special tests 1/30  Comments   SLR negative    Slump test     Pelvic symmetry  =    Segmental Spinal mobility     Heel walk negative    Toe walk negative    Tandem walk            DTR's  1/30 Left Right Comments   Patellar(L3-L4) = =    Achilles(S1-S2) = =            Joint mobility: 1/30   [x] Normal    [] Hypo   [] Hyper    Palpation: Denies  1/30    Functional Mobility/Transfers: Independent; returned to work out routine  1/30    Posture: Forward head and rounded shoulders  1/30    Gait: (include devices/WB status) WNL  1/30                         [x]  Patient  history, allergies, meds reviewed. Medical chart reviewed. See intake form.  9/20    Review Of Systems (ROS):  [x] Performed Review of systems (Integumentary, CardioPulmonary, Neurological) by intake and observation. Intake form has been scanned into medical record. Patient has been instructed to contact their primary care physician regarding ROS issues if not already being addressed at this time.  9/20    RESTRICTIONS/PRECAUTIONS: None    Exercises/Interventions: BILATERAL  ROM/stretches     SKTC 30 sec x 5 Start 9/20   DKTC     Prayer stretch     Supine HS 30 sec x 5 Start 9/20   Pelvic tilt     Hook lying rotation 10 sec x 10 each side  Start 9/22   Crossbody stretch (ITB) 30 sec x 5 Start 11/3   Piriformis 30 sec x 5 Start 9/20   Strengthening     SLR 3 x 10; 5# ^1/30   SLR ABD 3 x 10; 5# ^1/30   Start 9/29   Start 9/29; cane as feedback   Start 10/6   Black; start 10/11   TA Iso in 90/90 with UE flexion 3 x 10 11# ball; w/ ball squeeze;  ^10/17   clamshells 3 x 10; purple ^10/11   Start 10/2   Start 10/2   Start 9/22   TA Iso w/ heel slide (90/90) 30x  4#; ^12/4   Planks 3 x 30 sec  Start 10/18   Leg press 3 x 10; 180# Start 1/30   Leg extension 3 x 10; 80# Start 1/30   Leg curl 3 x 10; 80# Start 1/30            Manual Intervention             Prone PA      GISTM/STM      Lumbar Manip      SI Manip      Hip belt mobs      Hip LA  distraction      Rolling Posterior thigh and glute; ITB 8 min  Start 10/18     Therapeutic Exercise and NMR EXR  [x]  (97110) Provided verbal/tactile cueing for activities related to strengthening, flexibility, endurance, ROM  for improvements in proximal hip and core control with self care, mobility, lifting and ambulation.  [x]  [phone]) Provided verbal/tactile cueing for activities related to improving balance, coordination, kinesthetic sense, posture, motor skill, proprioception  to assist with core control in self care, mobility, lifting, and ambulation.     Therapeutic Activities:    [x]  [phone] or 19147) Provided verbal/tactile cueing for activities related to improving balance, coordination, kinesthetic sense, posture, motor skill, proprioception and motor activation to allow for proper function  with self care and ADLs  []  [phone]) Provided training and instruction to the patient for proper core and proximal hip recruitment and positioning with ambulation re-education     Home Exercise Program:    [x]  (21308) Reviewed/Progressed HEP activities related to strengthening, flexibility, endurance, ROM of core, proximal hip and LE for functional self-care, mobility, lifting and ambulation   []  (65784) Reviewed/Progressed HEP activities related to improving balance, coordination, kinesthetic sense, posture, motor skill, proprioception of core, proximal hip and LE for self care, mobility, lifting, and ambulation      Manual Treatments:  PROM / STM / Oscillations-Mobs:  G-I, II, III, IV (PA's, Inf., Post.)  []  (97140) Provided manual therapy to mobilize proximal hip and LS spine soft tissue/joints for the purpose of  modulating pain, promoting relaxation,  increasing ROM, reducing/eliminating soft tissue swelling/inflammation/restriction, improving soft tissue extensibility and allowing for proper ROM for normal function with self care, mobility, lifting and ambulation.     Modalities:   Ice 10 min  Start  10/11      Charges:  Timed Code Treatment Minutes: 45   Total Treatment Minutes: 75  5:30- 6:45       []  EVAL (LOW) 97161 (typically 20 minutes face-to-face)  []  EVAL (MOD) 09811 (typically 30 minutes face-to-face)  []  EVAL (HIGH) 97163 (typically 45 minutes face-to-face)  []  RE-EVAL     [x]  BJ(47829) x  1   []  IONTO  [x]  NMR (97112) x  1   []  VASO   []  Manual (97140) x       []  Other:  [x]  TA x  1    []  Mech Traction (56213)  []  ES(attended) (08657)      []  ES (un) (84696):     Goals:   Patient stated goal: Reduce pain; improve core strength and posture    Therapist goals for Patient:   Short Term Goals: To be achieved in: 2 weeks  1. Independent in HEP and progression per patient tolerance, in order to prevent re-injury. MET  2. Patient will have a decrease in pain to facilitate improvement in movement, function, and ADLs as indicated by Functional Deficits. MET    Long Term Goals: To be achieved in: 3-6 weeks  1. Disability index score of 10% or less for the [place] to assist with reaching prior level of function in 6 weeks. MET  2. Patient will demonstrate increased AROM to WNL, good LS mobility, good hip ROM to allow for proper joint functioning as indicated by patients Functional Deficits in 4 weeks. MET  3. Patient will demonstrate an increase in Strength to good proximal hip and core activation to allow for proper functional mobility as indicated by patients Functional Deficits in 6 weeks. MET  4. Patient will return to independent functional activities without increased symptoms or restriction in 4 weeks. MET    Progression Towards Functional goals:  [x]  Patient is progressing as expected towards functional goals listed.    []  Progression is slowed due to complexities listed.  []  Progression has been slowed due to co-morbidities.  []  Plan just implemented, too soon to assess goals progression  []  Other:     ASSESSMENT:  See eval    Treatment/Activity Tolerance:  [x]  Patient tolerated treatment well []   Patient limited by fatique  []  Patient limited by pain  []  Patient limited by other medical complications  [x]  Other:  1/30 Patient tolerated treatment well this session. Patient has met all goals and has returned to his PLOF without increased symptoms. Patient is interested in transitioning to [facility] program to further improve fitness and strength. Plan to D/C next session.    Patient education:  9/20 HEP provided and reviewed     Prognosis: [x]  Good []  Fair  []  Poor    Patient Requires Follow-up: [x]  Yes  []  No    PLAN:   [x]  Continue per plan of care []  Alter current plan (see comments)  []  Plan of care initiated []  Hold pending MD visit []  Discharge    Electronically signed by: Dillon Bjork PT, DPT

## 2017-01-22 ENCOUNTER — Inpatient Hospital Stay: Admit: 2017-01-22 | Primary: Family Medicine

## 2017-01-22 NOTE — Discharge Summary (Signed)
The [facility] ??? [facility], [address], [place] [address]  Phone: [phone]   Fax:     [phone]    Physical Therapy Daily Treatment Note  Date:  01/22/2017    Patient Name:  Zachary Gregory  [name]  DOB:  07-Dec-1980  MRN: [id number]  Restrictions/Precautions:    Medical/Treatment Diagnosis Information:  Diagnosis: S39.012 Lumbar Strain  Treatment Diagnosis: M45.5 Pain in Lumbar Spine  Insurance/Certification information:  PT Insurance Information: [facility]  Physician Information:  Referring Practitioner: Mart Piggs  Plan of care signed (Y/N): Y    Date of Patient follow up with Physician:     G-Code (if applicable):      Date G-Code Applied:  01/22/17 [place]:5% deficit  PT G-Codes  Functional Assessment Tool Used: Reunion  Score: 5% deficit  Functional Limitation: Changing and maintaining body position  Changing and Maintaining Body Position Goal Status (W2376): 0 percent impaired, limited or restricted  Changing and Maintaining Body Position Discharge Status (E8315): At least 1 percent but less than 20 percent impaired, limited or restricted    Progress Note: []   Yes  [x]   No  Next due by: D/C    Latex Allergy:  [x] NO      [] YES  Preferred Language for Healthcare:   [x] English       [] other:    Visit # Insurance Allowable   2  3/7  14  Used 2017 120     Pain level:  0/10   3/7     SUBJECTIVE:  3/7 Patient states that he has been doing really well. He states he has been traveling a lot and has been doing well.      OBJECTIVE:    ROM 3/7  Comments   Trunk flexion WNL    Trunk extension WNL    Trunk R sidebend WNL    Trunk L sidebend WNL    Trunk R rotation WNL    Trunk L rotation WNL    HS flexibility                 Strength  3/7 Left Right Comments   Hip flexion(L2) 5 5    Knee extension(L3) 5 5    Knee flexion(S1-2) 5 5 Pain in back with MMT   Ankle dorsiflexion(L4) 5 5    Toe extension(L5) 5 5    Ankle eversion/plantar flexion(S1) 5 5       Special tests 1/30  Comments   SLR negative    Slump test     Pelvic symmetry  =    Segmental Spinal mobility     Heel walk negative    Toe walk negative    Tandem walk            DTR???s 3/7 Left Right Comments   Patellar(L3-L4) = =    Achilles(S1-S2) = =            Joint mobility: 1/30   [x] Normal    [] Hypo   [] Hyper    Palpation: Denies  1/30    Functional Mobility/Transfers: Independent; returned to work out routine  1/30    Posture: Forward head and rounded shoulders  1/30    Gait: (include devices/WB status) WNL  1/30                         [x]  Patient history, allergies, meds reviewed. Medical  chart reviewed. See intake form.  9/20    Review Of Systems (ROS):  [x] Performed Review of systems (Integumentary, CardioPulmonary, Neurological) by intake and observation. Intake form has been scanned into medical record. Patient has been instructed to contact their primary care physician regarding ROS issues if not already being addressed at this time.  9/20    RESTRICTIONS/PRECAUTIONS: None    Exercises/Interventions: BILATERAL  ROM/stretches     Strengthening     Start 9/29   Start 9/29; cane as feedback   Start 10/6   Black; start 10/11   TA Iso in 90/90 with UE flexion 3 x 10 11# ball; w/ ball squeeze;  ^10/17   clamshells 3 x 10; purple ^10/11   Start 10/2   Start 10/2   Start 9/22   TA Iso w/ heel slide (90/90) 30x  4#; ^12/4   Planks 3 x 30 sec  On BOSU; ^3/7   Triple Threats 3 x 10 Start 11/17   Leg press 3 x 10; 200# ^3/7   Leg extension 3 x 10; 85# ^3/7   Leg curl 3 x 10; 85# ^3/7   Lateral walks 5 laps; silver band Start 3/7   Plank with lateral Shift on bosu 30 sec x 3 Start 3/7   Wall sits 30 sec x 3 Start 3/7   Paloff press cable column 3 x 10 each side; 6 plates Start 3/7       Manual Intervention             Prone PA      GISTM/STM      Lumbar Manip      SI Manip      Hip belt mobs      Hip LA distraction      Rolling Posterior thigh and glute; ITB 8 min  Start 10/18     Therapeutic Exercise and NMR  EXR  [x]  (97110) Provided verbal/tactile cueing for activities related to strengthening, flexibility, endurance, ROM  for improvements in proximal hip and core control with self care, mobility, lifting and ambulation.  [x]  [phone]) Provided verbal/tactile cueing for activities related to improving balance, coordination, kinesthetic sense, posture, motor skill, proprioception  to assist with core control in self care, mobility, lifting, and ambulation.     Therapeutic Activities:    [x]  [phone] or [phone]) Provided verbal/tactile cueing for activities related to improving balance, coordination, kinesthetic sense, posture, motor skill, proprioception and motor activation to allow for proper function  with self care and ADLs  []  [phone]) Provided training and instruction to the patient for proper core and proximal hip recruitment and positioning with ambulation re-education     Home Exercise Program:    [x]  (25427) Reviewed/Progressed HEP activities related to strengthening, flexibility, endurance, ROM of core, proximal hip and LE for functional self-care, mobility, lifting and ambulation   []  (06237) Reviewed/Progressed HEP activities related to improving balance, coordination, kinesthetic sense, posture, motor skill, proprioception of core, proximal hip and LE for self care, mobility, lifting, and ambulation      Manual Treatments:  PROM / STM / Oscillations-Mobs:  G-I, II, III, IV (PA's, Inf., Post.)  []  (97140) Provided manual therapy to mobilize proximal hip and LS spine soft tissue/joints for the purpose of modulating pain, promoting relaxation,  increasing ROM, reducing/eliminating soft tissue swelling/inflammation/restriction, improving soft tissue extensibility and allowing for proper ROM for normal function with self care, mobility, lifting and ambulation.     Modalities:   Ice 10 min  Start 10/11      Charges:  Timed Code Treatment Minutes: 45   Total Treatment Minutes: 75  2:10- 3:25       []  EVAL (LOW) 97161  (typically 20 minutes face-to-face)  []  EVAL (MOD) 28413 (typically 30 minutes face-to-face)  []  EVAL (HIGH) 97163 (typically 45 minutes face-to-face)  []  RE-EVAL     []  KG(40102) x      []  IONTO  [x]  NMR (72536) x  1   []  VASO   [x]  Manual (97140) x  1    []  Other:  [x]  TA x  1    []  Mech Traction (64403)  []  ES(attended) (47425)      []  ES (un) (95638):     Goals:   Patient stated goal: Reduce pain; improve core strength and posture    Therapist goals for Patient:   Short Term Goals: To be achieved in: 2 weeks  1. Independent in HEP and progression per patient tolerance, in order to prevent re-injury. MET  2. Patient will have a decrease in pain to facilitate improvement in movement, function, and ADLs as indicated by Functional Deficits. MET    Long Term Goals: To be achieved in: 3-6 weeks  1. Disability index score of 10% or less for the Reunion to assist with reaching prior level of function in 6 weeks. MET  2. Patient will demonstrate increased AROM to WNL, good LS mobility, good hip ROM to allow for proper joint functioning as indicated by patients Functional Deficits in 4 weeks. MET  3. Patient will demonstrate an increase in Strength to good proximal hip and core activation to allow for proper functional mobility as indicated by patients Functional Deficits in 6 weeks. MET  4. Patient will return to independent functional activities without increased symptoms or restriction in 4 weeks. MET    Progression Towards Functional goals:  [x]  Patient is progressing as expected towards functional goals listed.    []  Progression is slowed due to complexities listed.  []  Progression has been slowed due to co-morbidities.  []  Plan just implemented, too soon to assess goals progression  []  Other:     ASSESSMENT:  See eval    Treatment/Activity Tolerance:  [x]  Patient tolerated treatment well []  Patient limited by fatique  []  Patient limited by pain  []  Patient limited by other medical complications  [x]  Other:  3/7  Patient has progressed well in therapy and has met all short term and long term goals. Patient has improved ROM, strength, and neuromuscular control. Patient is independent with HEP and has returned to a gym and working out program. Patient is interested in continuing with [employer] program for guidance. Patient no longer requires skilled physical therapy at this time. Discharge from physical therapy.    Patient education:  9/20 HEP provided and reviewed     Prognosis: [x]  Good []  Fair  []  Poor    Patient Requires Follow-up: []  Yes  [x]  No    PLAN:   []  Continue per plan of care []  Alter current plan (see comments)  []  Plan of care initiated []  Hold pending MD visit [x]  Discharge    Electronically signed by: Abby Potash PT, DPT

## 2017-04-02 NOTE — Telephone Encounter (Signed)
Called and left message for patient to schedule testing. [facility] number left.

## 2017-04-03 NOTE — Telephone Encounter (Signed)
Patient called and left voicemail to schedule testing. Called a left message for patient to call back to schedule. [facility] number left.

## 2017-04-15 ENCOUNTER — Ambulatory Visit: Payer: PRIVATE HEALTH INSURANCE

## 2017-04-15 ENCOUNTER — Inpatient Hospital Stay: Admit: 2017-04-15 | Discharge: 2017-04-15 | Payer: PRIVATE HEALTH INSURANCE

## 2017-04-15 DIAGNOSIS — R072 Precordial pain: Secondary | ICD-10-CM

## 2017-04-15 NOTE — Progress Notes (Signed)
Borderline stress test result called and faxed to Dr Dellie Burns office

## 2017-04-15 NOTE — Unmapped (Signed)
Resting EKG reviewed by Dr. Raza  prior to starting stress test

## 2017-07-02 ENCOUNTER — Inpatient Hospital Stay: Admit: 2017-07-02 | Payer: PRIVATE HEALTH INSURANCE

## 2017-07-02 DIAGNOSIS — M79661 Pain in right lower leg: Secondary | ICD-10-CM

## 2017-07-23 ENCOUNTER — Inpatient Hospital Stay: Admit: 2017-07-23 | Payer: PRIVATE HEALTH INSURANCE

## 2017-07-23 DIAGNOSIS — R2242 Localized swelling, mass and lump, left lower limb: Secondary | ICD-10-CM

## 2017-08-03 ENCOUNTER — Inpatient Hospital Stay: Payer: PRIVATE HEALTH INSURANCE

## 2024-08-25 NOTE — Progress Notes (Signed)
 "      [facility] PRE-OPERATIVE INSTRUCTIONS       DOS: __10/17/2024__        Pre-Op Instructions     Patients receiving local anesthetic only will arrive one hour prior to the procedure, all other patients will arrive 1.5 hours prior to procedure time.    [x]   A History and Physical will be required within 30 days prior to surgery date. Some patients may require cardiac or pulmonary clearance. H&P will be completed DOS for Endo/colonoscopy patients.     [x]   Reviewed Medical and Surgical history, medication list, confirmed with patient any implants, allergies, bleeding disorders, OSA and reactions to Anesthesia.    [x]   If there is a change in physical condition between now and the day of surgery, please notify your surgeon. This includes a cough, cold, fever, sore throat, nausea, vomiting and diarrhea. Also notify your surgeon if you experience dizziness, shortness of breath or blurred vision.    [x]  Reviewed hx of C-Diff, MRSA, VRE and/or recent use of Antibiotics     [x]   All patients having a procedure must have a ride home by a responsible person that is over the age of 82 and ensure it is someone that we can share medical information with. After discharge, a responsible adult needs to stay with you for 24 hours. There is a limit of 2 adult visitors per room.     If unable to secure ride and/or care taker, please contact surgeon's office.      [x]   No alcohol, smoking or marijuana use 24 hours prior to surgery. Any use of recreational drugs must be stopped 5 days prior to surgery.     [x]   NPO after midnight (Any heart, BP, seizure, thyroid and breathing medications are okay to take the morning of surgery with a small sip of water 4 hours prior to procedure).    The morning of surgery, you may brush your teeth, just no swallowing water. Also, NO gum, candy, mints or ice chips.    [x]   For Colonoscopy's, follow prep-instructions as indicated by physician.     []  All GLP1 diabetic injections (Ozempic,  Mounjaro, [id number] etc.), and other weight loss medications such as Adipex, Qsymia, Regimex, Contrave, Rybelsus, Victoza & Alli/Zencal or Tenuate hold 7 days prior to the procedure. Hold SGLT-2 inhibitor medications (Invokana, Farxiga, Jardiance etc.) 3-4 days prior to procedure.   []   Long-acting insulin should be administered the evening before, take only half of usual dose.  []   Patients with a insulin pump, keep set on basal rate on day of surgery.   []  Do not give any correction doses of insulin the morning of procedure.  []  Do not take any oral diabetic medications the morning of procedure.  Always check with prescribing doctor if there are any questions.     []   You should shower the night before or the morning of surgery with an antibacterial soap i.e. safeguard or dial. Your surgeon may require you to use Hibiclens (an antiseptic skin cleanser) please follow physicians instructions.     []   Do not shave or wax operative site 96 hours (4 days) prior to procedure.      [x]   No jewelry including body piercings, no makeup, or nail polish. No lotion, powders, creams, perfumes or metal hairclips.     [x]   Dress in loose comfortable clothing. Leave all valuables at home.    [x]   Please contact your surgeon  if you are taking blood thinners, aspirin, anti-inflammatory medications, vitamins, or fish oil. They may require you to stop taking them 5-7 days prior.             ITEMS TO BRING TO THE HOSPITAL ON DOS:     []   Your prescribed rescue inhaler     [x]   ID and insurance card, form of payment if have co-pay, current medication list, living will and POA (if you have one).     []   Home c-pap and/or home O2 oxygen tank.      []   Any assistive medical devices (i.e.Walker, cane, wheelchair, etc.) or immobilizer or sling needed postoperatively      [x]   You may bring dentures, glasses, contacts, and/or hearing aids, however, they will need to be removed before your procedure. Please bring cases to store them in for  safekeeping and leave them with the person you came with.        Our goal is to provide you with safe and excellent care. Please contact pre-admission testing if you have any further questions. (Please note, these are generalized instructions for all surgical cases. You may be provided with more specific instructions according to your surgeon.)     [address] [place], [place] [address]     Hospital: [phone]    Pre-Admission Testing: [phone]    "

## 2024-08-31 ENCOUNTER — Inpatient Hospital Stay: Payer: PRIVATE HEALTH INSURANCE | Attending: Gastroenterology

## 2024-08-31 MED ORDER — NORMAL SALINE FLUSH 0.9 % IV SOLN
0.9 | Freq: Two times a day (BID) | INTRAVENOUS | Status: DC
Start: 2024-08-31 — End: 2024-08-31

## 2024-08-31 MED ORDER — GLYCOPYRROLATE 0.2 MG/ML IJ SOLN
0.2 | Freq: Once | INTRAMUSCULAR | Status: DC | PRN
Start: 2024-08-31 — End: 2024-08-31
  Administered 2024-08-31: 15:00:00 .2 via INTRAVENOUS

## 2024-08-31 MED ORDER — SODIUM CHLORIDE 0.9 % IV SOLN
0.9 | INTRAVENOUS | Status: DC | PRN
Start: 2024-08-31 — End: 2024-08-31
  Administered 2024-08-31: 15:00:00 via INTRAVENOUS

## 2024-08-31 MED ORDER — PROPOFOL 200 MG/20ML IV EMUL
200 | Freq: Once | INTRAVENOUS | Status: DC | PRN
Start: 2024-08-31 — End: 2024-08-31
  Administered 2024-08-31: 15:00:00 175 via INTRAVENOUS
  Administered 2024-08-31: 15:00:00 150 via INTRAVENOUS

## 2024-08-31 MED ORDER — LIDOCAINE HCL (PF) 2 % IJ SOLN
2 | Freq: Once | INTRAMUSCULAR | Status: DC | PRN
Start: 2024-08-31 — End: 2024-08-31
  Administered 2024-08-31: 15:00:00 50 via INTRAVENOUS

## 2024-08-31 MED ORDER — LIDOCAINE HCL (PF) 2 % IJ SOLN
2 | INTRAMUSCULAR | Status: AC
Start: 2024-08-31 — End: 2024-08-31

## 2024-08-31 MED ORDER — NORMAL SALINE FLUSH 0.9 % IV SOLN
0.9 | INTRAVENOUS | Status: DC | PRN
Start: 2024-08-31 — End: 2024-08-31

## 2024-08-31 MED ORDER — PROPOFOL 200 MG/20ML IV EMUL
200 | INTRAVENOUS | Status: AC
Start: 2024-08-31 — End: 2024-08-31

## 2024-08-31 MED ORDER — ONDANSETRON HCL 4 MG/2ML IJ SOLN
4 | Freq: Once | INTRAMUSCULAR | Status: DC | PRN
Start: 2024-08-31 — End: 2024-08-31

## 2024-08-31 MED FILL — XYLOCAINE-MPF 2 % IJ SOLN: 2 % | INTRAMUSCULAR | Qty: 5

## 2024-08-31 MED FILL — PROPOFOL 200 MG/20ML IV EMUL: 200 MG/20ML | INTRAVENOUS | Qty: 20

## 2024-08-31 NOTE — Flowsheet Note (Signed)
"  Pt will cont to monitor symptoms and notify MD if needed.  "

## 2024-08-31 NOTE — H&P (Signed)
"          Gastroenterology Note             Pre-operative History and Physical    Patient: Zachary Gregory  DOB: [birth year]  CSN: [id number]    History Obtained From:  patient and/or guardian.     HISTORY OF PRESENT ILLNESS:    The patient is a 43 y.o. male  here for rectal bleeding, family history of colon polyps in his mother age 55 years..      Past Medical History:    Past Medical History:   Diagnosis Date    Sleep apnea     not using  CPAP     Past Surgical History:    Past Surgical History:   Procedure Laterality Date    MOLE REMOVAL      WISDOM TOOTH EXTRACTION       Medications Prior to Admission:   No current facility-administered medications on file prior to encounter.     Current Outpatient Medications on File Prior to Encounter   Medication Sig Dispense Refill    nicotine (NICODERM CQ) 7 MG/24HR Place 1 patch onto the skin in the morning.          Allergies:  Aspirin and Penicillins      Social History:   Social History     Tobacco Use    Smoking status: Former     Current packs/day: 0.00     Types: Cigarettes     Quit date: 2020     Years since quitting: 5.7    Smokeless tobacco: Never   Substance Use Topics    Alcohol use: Yes     Comment: oocas     Family History:   History reviewed. No pertinent family history.    PHYSICAL EXAM:      BP 138/72   Pulse 58   Temp 98.4 F (36.9 C) (Infrared)   Resp 18   Ht 1.803 m (5' 11)   Wt 108.9 kg (240 lb)   SpO2 97%   BMI 33.47 kg/m  I        Heart:   within normal limits    Lungs:  Unlabored    Abdomen:   soft, NT, ND      ASA Grade:  ASA 2 - Patient with mild systemic disease with no functional limitations    Mallampati Class: 2      ASSESSMENT AND PLAN:    1.  Patient is a 43 y.o. male here for Colonoscopy.   2.  Procedure options, risks and benefits reviewed with patient or guardian.  Patient or guardian expresses understanding and wishes to proceed.    [name], MD, [facility]  [facility]  08/31/2024    Please note that some or all of this record was  generated using voice recognition software. If there are any questions about the content of this document, please contact the author as some errors in translation may have occurred.    "

## 2024-08-31 NOTE — Anesthesia Pre Procedure (Signed)
 "Department of Anesthesiology  Preprocedure Note       Name:  Zachary Gregory   Age:  43 y.o.  DOB:  [birth year]                                          MRN:  [id number]         Date:  08/31/2024      Surgeon: Clotilde):  Wyvonne Pearlie POUR, MD    Procedure: Procedure(s):  COLONOSCOPY    Medications prior to admission:   Prior to Admission medications   Medication Sig Start Date End Date Taking? Authorizing Provider   nicotine (NICODERM CQ) 7 MG/24HR Place 1 patch onto the skin in the morning.    [provider]       Current medications:    Current Facility-Administered Medications   Medication Dose Route Frequency Provider Last Rate Last Admin    sodium chloride  flush 0.9 % injection 5-40 mL  5-40 mL IntraVENous 2 times per day Burdette Ozell Fairy, MD        sodium chloride  flush 0.9 % injection 5-40 mL  5-40 mL IntraVENous PRN Burdette Ozell Fairy, MD        0.9 % sodium chloride  infusion   IntraVENous PRN Burdette Ozell Fairy, MD           Allergies:    Allergies   Allergen Reactions    Aspirin Other (See Comments) and Nausea And Vomiting    Penicillins Other (See Comments) and Nausea And Vomiting       Problem List:  There is no problem list on file for this patient.      Past Medical History:        Diagnosis Date    Sleep apnea     not using  CPAP       Past Surgical History:        Procedure Laterality Date    MOLE REMOVAL      WISDOM TOOTH EXTRACTION         Social History:    Social History     Tobacco Use    Smoking status: Former     Current packs/day: 0.00     Types: Cigarettes     Quit date: 2020     Years since quitting: 5.7    Smokeless tobacco: Never   Substance Use Topics    Alcohol use: Yes     Comment: oocas                                Counseling given: Not Answered      Vital Signs (Current):   Vitals:    08/25/24 1553   Weight: 108.9 kg (240 lb)   Height: 1.803 m (5' 11)                                              BP Readings from Last 3 Encounters:   No data found  for BP       NPO Status:  BMI:   Wt Readings from Last 3 Encounters:   08/25/24 108.9 kg (240 lb)     Body mass index is 33.47 kg/m.    CBC: No results found for: WBC, RBC, HGB, HCT, MCV, RDW, PLT    CMP: No results found for: NA, K, CL, CO2, BUN, CREATININE, GFRAA, AGRATIO, LABGLOM, GLUCOSE, GLU, CALCIUM, BILITOT, ALKPHOS, AST, ALT    POC Tests: No results for input(s): POCGLU, POCNA, POCK, POCCL, POCBUN, POCHEMO, POCHCT in the last 72 hours.    Coags: No results found for: PROTIME, INR, APTT    HCG (If Applicable): No results found for: PREGTESTUR, PREGSERUM, HCG, HCGQUANT     ABGs: No results found for: PHART, PO2ART, PCO2ART, HCO3ART, BEART, O2SATART     Type & Screen (If Applicable):  No results found for: ABORH, LABANTI    Drug/Infectious Status (If Applicable):  No results found for: HIV, HEPCAB    COVID-19 Screening (If Applicable): No results found for: COVID19        Anesthesia Evaluation    Patient summary reviewed and Nursing notes reviewed      Airway:  Mallampati: III  TM distance: >3 FB   Neck ROM: full    Mouth opening: > = 3 FB   Dental:           Pulmonary:       (+)               sleep apnea:                    (-) COPD, asthma and shortness of breath         Cardiovascular:           (-) hypertension, valvular problems/murmurs, past MI, CAD, dysrhythmias,  angina and no hyperlipidemia            Neuro/Psych:       (-) seizures, TIA and CVA          GI/Hepatic/Renal:         (-) GERD, PUD, liver disease and no renal disease     Endo/Other:         (-) diabetes mellitus               Abdominal:              Vascular:  negative vascular ROS.       Other Findings:            Anesthesia Plan      MAC     ASA 2     (I discussed intravenous sedation to the patient's satisfaction including risks and alternatives. The patient agreed  with the plan and has no further questions.    Ozell Fairy Magnus, MD )  Induction: intravenous.      Anesthetic plan and risks discussed with patient.      Plan discussed with CRNA.                  Ozell Fairy Magnus, MD   08/31/2024            "

## 2024-08-31 NOTE — Progress Notes (Signed)
 [facility] has verified that procedural consent is correctly filled out prior to the start of the procedure.

## 2024-08-31 NOTE — Progress Notes (Signed)
"  Ambulatory Surgery/Endo Procedure Discharge Note    Vitals:    08/31/24 1150   BP: 130/85   Pulse: 65   Resp: 17   Temp: 98.3 F (36.8 C)   SpO2: 98%       In: 300 [I.V.:300]  Out: -     Pain assessment:  none  Pain Level: 0        Patient discharged to home with personal belongings. Patient discharged via wheel chair by RN to waiting family/S.O.       08/31/2024 12:06 PM  "

## 2024-08-31 NOTE — Progress Notes (Signed)
"  Teaching / education initiated regarding perioperative experience, expectations, and pain management during stay. Patient verbalized understanding.    "

## 2024-08-31 NOTE — Progress Notes (Signed)
"  Pt arrived to preop room # 6 from endo. Report received from Endo RN and CRNA. Pt resting quietly in bed, respirations even and unlabored. Attached to Preop monitoring system. Alarms and parameters set. Will continue to monitor pt closely. Call light in reach.   "

## 2024-08-31 NOTE — Discharge Instructions (Addendum)
"  ENDOSCOPY DISCHARGE INSTRUCTIONS:    MiraLAX 17 g orally once daily together with Benefiber 1 tablespoonful orally once daily.  Follow-up with me as outpatient in the next 6 to 8 weeks if rectal bleeding is persistent.  Colonoscopy for screening at age 43 years.    Call the physician that did your procedure for any questions or concern:    [facility]: [phone]  DR. NAV GRANDHI    ACTIVITY:    There are potential side effects to the medications used for sedation and anesthesia during your procedure.  These include:  Dizziness or light-headedness, confusion or memory loss, delayed reaction times, loss of coordination, nausea and vomiting.  Because of your increased risk for injury, we ask that you observe the following precautions:  For the next 24 hours,  DO NOT operate an automobile, bicycle, motorcycle, lawn mower, power tools or large equipment of any kind.  Do not drink alcohol, sign any legal documents or make any legal decisions for 24 hours.  Do not bend your head over lower than your heart.  DO sit on the side of bed/couch awhile before getting up.  Plan on bedrest or quiet relaxation today.  You may resume normal activities in 24 hours.    DIET:    Your first meal today should be light, avoiding spicy and fatty foods.  If you tolerate this first meal, then you may advance to your regular diet unless otherwise advised by your physician.    NORMAL SYMPTOMS:  -Mild sore throat if youve had an EGD   -Gaseous discomfort    NOTIFY YOUR PHYSICIAN IF THESE SYMPTOMS OCCUR:  1. Fever (greater than 100)  5. Increased abdominal bloating  2. Severe pain    6. Excessive bleeding  3. Nausea and vomiting  7. Chest pain                                                                    4. Chills    8. Shortness of breath    Please review these discharge instructions this evening or tomorrow for  information you may have forgotten.            We want to thank you for choosing the [facility] as your  health care provider. We always strive to provide you with excellent care while you are here. You may receive a survey in the mail regarding your care. We would appreciate you taking a few minutes of your time to complete this survey.  "

## 2024-08-31 NOTE — Anesthesia Postprocedure Evaluation (Signed)
"  Department of Anesthesiology  Postprocedure Note    Patient: Zachary Gregory  MRN: [id number]  Birthdate: 01-01-1981  Date of evaluation: 08/31/2024    Procedure Summary       Date: 08/31/24 Room / Location: [facility] ENDO 01 / [facility]    Anesthesia Start: 1055 Anesthesia Stop: 1132    Procedure: COLONOSCOPY Diagnosis:       Hemorrhage of rectum and anus      (Hemorrhage of rectum and anus [K62.5])    Surgeons: Wyvonne Pearlie POUR, MD Responsible Provider: Burdette Ozell Fairy, MD    Anesthesia Type: MAC ASA Status: 2            Anesthesia Type: MAC    Aldrete Phase I: Aldrete Score: 10    Aldrete Phase II: Aldrete Score: 10    Anesthesia Post Evaluation    Patient location during evaluation: PACU  Level of consciousness: awake and alert  Airway patency: patent  Nausea & Vomiting: no nausea and no vomiting  Cardiovascular status: blood pressure returned to baseline  Respiratory status: acceptable  Hydration status: euvolemic  Comments: Postoperative Anesthesia Note    Name:    Zachary Gregory  MRN:      [id number]    Patient Vitals in the past 12 hrs:  08/31/24 1135, BP:115/76, Pulse:86, Resp:18, SpO2:94 %  08/31/24 1130, BP:124/73, Pulse:94, Resp:12, SpO2:93 %  08/31/24 1125, BP:132/68, Pulse:95, Resp:12, SpO2:93 %  08/31/24 1124, BP:125/69, Temp:98.3 F (36.8 C), Temp dmr:Pwqmjmzi, Pulse:95, Resp:12, SpO2:93 %  08/31/24 1042, BP:138/72, Temp:98.4 F (36.9 C), Temp dmr:Pwqmjmzi, Pulse:58, Resp:18, SpO2:97 %, Height:1.803 m (5' 11), Weight:108.9 kg (240 lb)     LABS:    CBC  No results found for: WBC, HGB, HCT, PLT  RENAL  No results found for: NA, K, CL, CO2, BUN, CREATININE, GLUCOSE  COAGS  No results found for: PROTIME, INR, APTT    Intake & Output:  @24HRIO @    Nausea & Vomiting:  No    Level of Consciousness:  Awake    Pain Assessment:  Adequate analgesia    Anesthesia Complications:  No apparent anesthetic complications    SUMMARY      Vital signs stable  OK to  discharge from Stage I post anesthesia care.  Care transferred from Anesthesiology department on discharge from perioperative area   Pain management: satisfactory to patient      No notable events documented.  "

## 2024-09-03 ENCOUNTER — Ambulatory Visit
Admit: 2024-09-03 | Discharge: 2024-09-03 | Payer: PRIVATE HEALTH INSURANCE | Attending: General Practice | Primary: Family Medicine

## 2024-09-03 LAB — POCT COVID-19 RAPID, NAAT
Lot Number: 931047
SARS-COV-2, RdRp gene: NEGATIVE

## 2024-09-03 MED ORDER — AZITHROMYCIN 250 MG PO TABS
250 | ORAL_TABLET | ORAL | 0 refills | 5.00000 days | Status: AC
Start: 2024-09-03 — End: 2024-09-13

## 2024-09-03 MED ORDER — FLUTICASONE PROPIONATE 50 MCG/ACT NA SUSP
50 | Freq: Every day | NASAL | 0 refills | 60.00000 days | Status: AC
Start: 2024-09-03 — End: ?

## 2024-09-03 NOTE — Patient Instructions (Signed)
 KEEP WELL HYDRATION

## 2024-09-03 NOTE — Progress Notes (Addendum)
"  Adalberto Metzgar (DOB:  03/14/1981) is a 43 y.o. male,New patient, here for evaluation of the following chief complaint(s):  Pharyngitis (Sore throat , cough , pressure in both ears , and mucus in back of throat x 4 days . Pt states that he had a colonoscopy on Tuesday and noticed the sore throat , cough , and mucus in the back of throat afterwards .  )      ASSESSMENT/PLAN:  1. Acute cough  - POCT COVID-19 Rapid, NAAT NEGATIVE  - fluticasone  (FLONASE ) 50 MCG/ACT nasal spray; 1 spray by Each Nostril route daily  Dispense: 16 g; Refill: 0    2. Acute maxillary sinusitis, recurrence not specified  - azithromycin  (ZITHROMAX ) 250 MG tablet; 500mg  on day 1 followed by 250mg  on days 2 - 5  Dispense: 6 tablet; Refill: 0  - fluticasone  (FLONASE ) 50 MCG/ACT nasal spray; 1 spray by Each Nostril route daily  Dispense: 16 g; Refill: 0       Return if symptoms worsen or fail to improve.    SUBJECTIVE/OBJECTIVE:  PRESENT TO CLINIC WITH COUGH,THROAT HURT,EAR PRESSURE AND POST NASAL DRIP . WAS IN HOSPITAL FOR COLONOSCOPY.       History provided by:  Patient      Vitals:    09/03/24 1051 09/03/24 1113   BP: (!) 142/85 130/83   BP Site: Right Upper Arm Right Upper Arm   Patient Position: Sitting Sitting   BP Cuff Size: Large Adult Large Adult   Pulse: 94    Temp: 98.4 F (36.9 C)    TempSrc: Oral    SpO2: 95%    Weight: 112.8 kg (248 lb 11.2 oz)    Height: 1.803 m (5' 11)        Review of Systems   HENT:  Positive for congestion, ear pain and sore throat.    Respiratory:  Positive for cough.        Physical Exam  Constitutional:       Appearance: Normal appearance.   HENT:      Head: Normocephalic and atraumatic.      Nose: Congestion present.      Mouth/Throat:      Mouth: Mucous membranes are moist.   Eyes:      Pupils: Pupils are equal, round, and reactive to light.   Pulmonary:      Effort: Pulmonary effort is normal.      Breath sounds: Normal breath sounds.   Musculoskeletal:         General: Normal range of motion.       Cervical back: Normal range of motion and neck supple.   Skin:     General: Skin is warm.   Neurological:      Mental Status: He is alert and oriented to person, place, and time.   Psychiatric:         Mood and Affect: Mood normal.         Behavior: Behavior normal.           An electronic signature was used to authenticate this note.    --Randol Zumstein, DO   "
